# Patient Record
Sex: Female | Born: 1940 | Race: White | Marital: Married | Smoking: Never smoker
Health system: Southern US, Community
[De-identification: ages and names within clinical notes are randomized; demographics above are authoritative.]

## PROBLEM LIST (undated history)

## (undated) DIAGNOSIS — H9190 Unspecified hearing loss, unspecified ear: Secondary | ICD-10-CM

## (undated) DIAGNOSIS — I10 Essential (primary) hypertension: Secondary | ICD-10-CM

## (undated) DIAGNOSIS — E079 Disorder of thyroid, unspecified: Secondary | ICD-10-CM

## (undated) DIAGNOSIS — K900 Celiac disease: Secondary | ICD-10-CM

## (undated) HISTORY — PX: ABDOMINAL HYSTERECTOMY: SHX81

## (undated) HISTORY — DX: Unspecified hearing loss, unspecified ear: H91.90

## (undated) HISTORY — DX: Essential (primary) hypertension: I10

## (undated) HISTORY — DX: Celiac disease: K90.0

## (undated) HISTORY — DX: Disorder of thyroid, unspecified: E07.9

---

## 2010-12-08 ENCOUNTER — Emergency Department: Payer: Self-pay | Admitting: *Deleted

## 2011-09-04 ENCOUNTER — Ambulatory Visit: Payer: Self-pay | Admitting: Ophthalmology

## 2011-11-06 ENCOUNTER — Ambulatory Visit: Payer: Self-pay | Admitting: Ophthalmology

## 2011-12-16 ENCOUNTER — Other Ambulatory Visit: Payer: Self-pay | Admitting: *Deleted

## 2011-12-16 DIAGNOSIS — Z1231 Encounter for screening mammogram for malignant neoplasm of breast: Secondary | ICD-10-CM

## 2011-12-24 ENCOUNTER — Ambulatory Visit: Payer: Self-pay

## 2012-01-21 ENCOUNTER — Ambulatory Visit
Admission: RE | Admit: 2012-01-21 | Discharge: 2012-01-21 | Disposition: A | Discharge status: Home / Self Care | Payer: Medicare Other | Source: Ambulatory Visit | Attending: *Deleted | Admitting: *Deleted

## 2012-01-21 DIAGNOSIS — Z1231 Encounter for screening mammogram for malignant neoplasm of breast: Secondary | ICD-10-CM

## 2012-03-10 ENCOUNTER — Ambulatory Visit: Payer: Self-pay | Admitting: Family Medicine

## 2012-12-28 ENCOUNTER — Ambulatory Visit (INDEPENDENT_AMBULATORY_CARE_PROVIDER_SITE_OTHER): Payer: Medicare Other | Admitting: Podiatry

## 2012-12-28 ENCOUNTER — Ambulatory Visit (INDEPENDENT_AMBULATORY_CARE_PROVIDER_SITE_OTHER): Payer: Medicare Other

## 2012-12-28 ENCOUNTER — Encounter: Payer: Self-pay | Admitting: Podiatry

## 2012-12-28 VITALS — BP 154/84 | HR 64 | Resp 14 | Ht 61.0 in | Wt 138.0 lb

## 2012-12-28 DIAGNOSIS — M79672 Pain in left foot: Secondary | ICD-10-CM

## 2012-12-28 DIAGNOSIS — M722 Plantar fascial fibromatosis: Secondary | ICD-10-CM

## 2012-12-28 DIAGNOSIS — M79609 Pain in unspecified limb: Secondary | ICD-10-CM

## 2012-12-28 MED ORDER — METHYLPREDNISOLONE (PAK) 4 MG PO TABS: ORAL_TABLET | ORAL | Status: DC

## 2012-12-28 MED ORDER — MELOXICAM 15 MG PO TABS: 15.0000 mg | ORAL_TABLET | Freq: Every day | ORAL | Status: DC

## 2012-12-28 NOTE — Patient Instructions (Signed)
Plantar Fasciitis (Heel Spur Syndrome) with Rehab The plantar fascia is a fibrous, ligament-like, soft-tissue structure that spans the bottom of the foot. Plantar fasciitis is a condition that causes pain in the foot due to inflammation of the tissue. SYMPTOMS   Pain and tenderness on the underneath side of the foot.  Pain that worsens with standing or walking. CAUSES  Plantar fasciitis is caused by irritation and injury to the plantar fascia on the underneath side of the foot. Common mechanisms of injury include:  Direct trauma to bottom of the foot.  Damage to a small nerve that runs under the foot where the main fascia attaches to the heel bone.  Stress placed on the plantar fascia due to bone spurs. RISK INCREASES WITH:   Activities that place stress on the plantar fascia (running, jumping, pivoting, or cutting).  Poor strength and flexibility.  Improperly fitted shoes.  Tight calf muscles.  Flat feet.  Failure to warm-up properly before activity.  Obesity. PREVENTION  Warm up and stretch properly before activity.  Allow for adequate recovery between workouts.  Maintain physical fitness:  Strength, flexibility, and endurance.  Cardiovascular fitness.  Maintain a health body weight.  Avoid stress on the plantar fascia.  Wear properly fitted shoes, including arch supports for individuals who have flat feet. PROGNOSIS  If treated properly, then the symptoms of plantar fasciitis usually resolve without surgery. However, occasionally surgery is necessary. RELATED COMPLICATIONS   Recurrent symptoms that may result in a chronic condition.  Problems of the lower back that are caused by compensating for the injury, such as limping.  Pain or weakness of the foot during push-off following surgery.  Chronic inflammation, scarring, and partial or complete fascia tear, occurring more often from repeated injections. TREATMENT  Treatment initially involves the use of  ice and medication to help reduce pain and inflammation. The use of strengthening and stretching exercises may help reduce pain with activity, especially stretches of the Achilles tendon. These exercises may be performed at home or with a therapist. Your caregiver may recommend that you use heel cups of arch supports to help reduce stress on the plantar fascia. Occasionally, corticosteroid injections are given to reduce inflammation. If symptoms persist for greater than 6 months despite non-surgical (conservative), then surgery may be recommended.  MEDICATION   If pain medication is necessary, then nonsteroidal anti-inflammatory medications, such as aspirin and ibuprofen, or other minor pain relievers, such as acetaminophen, are often recommended.  Do not take pain medication within 7 days before surgery.  Prescription pain relievers may be given if deemed necessary by your caregiver. Use only as directed and only as much as you need.  Corticosteroid injections may be given by your caregiver. These injections should be reserved for the most serious cases, because they may only be given a certain number of times. HEAT AND COLD  Cold treatment (icing) relieves pain and reduces inflammation. Cold treatment should be applied for 10 to 15 minutes every 2 to 3 hours for inflammation and pain and immediately after any activity that aggravates your symptoms. Use ice packs or massage the area with a piece of ice (ice massage).  Heat treatment may be used prior to performing the stretching and strengthening activities prescribed by your caregiver, physical therapist, or athletic trainer. Use a heat pack or soak the injury in warm water. SEEK IMMEDIATE MEDICAL CARE IF:  Treatment seems to offer no benefit, or the condition worsens.  Any medications produce adverse side effects. EXERCISES RANGE   OF MOTION (ROM) AND STRETCHING EXERCISES - Plantar Fasciitis (Heel Spur Syndrome) These exercises may help you  when beginning to rehabilitate your injury. Your symptoms may resolve with or without further involvement from your physician, physical therapist or athletic trainer. While completing these exercises, remember:   Restoring tissue flexibility helps normal motion to return to the joints. This allows healthier, less painful movement and activity.  An effective stretch should be held for at least 30 seconds.  A stretch should never be painful. You should only feel a gentle lengthening or release in the stretched tissue. RANGE OF MOTION - Toe Extension, Flexion  Sit with your right / left leg crossed over your opposite knee.  Grasp your toes and gently pull them back toward the top of your foot. You should feel a stretch on the bottom of your toes and/or foot.  Hold this stretch for __________ seconds.  Now, gently pull your toes toward the bottom of your foot. You should feel a stretch on the top of your toes and or foot.  Hold this stretch for __________ seconds. Repeat __________ times. Complete this stretch __________ times per day.  RANGE OF MOTION - Ankle Dorsiflexion, Active Assisted  Remove shoes and sit on a chair that is preferably not on a carpeted surface.  Place right / left foot under knee. Extend your opposite leg for support.  Keeping your heel down, slide your right / left foot back toward the chair until you feel a stretch at your ankle or calf. If you do not feel a stretch, slide your bottom forward to the edge of the chair, while still keeping your heel down.  Hold this stretch for __________ seconds. Repeat __________ times. Complete this stretch __________ times per day.  STRETCH  Gastroc, Standing  Place hands on wall.  Extend right / left leg, keeping the front knee somewhat bent.  Slightly point your toes inward on your back foot.  Keeping your right / left heel on the floor and your knee straight, shift your weight toward the wall, not allowing your back to  arch.  You should feel a gentle stretch in the right / left calf. Hold this position for __________ seconds. Repeat __________ times. Complete this stretch __________ times per day. STRETCH  Soleus, Standing  Place hands on wall.  Extend right / left leg, keeping the other knee somewhat bent.  Slightly point your toes inward on your back foot.  Keep your right / left heel on the floor, bend your back knee, and slightly shift your weight over the back leg so that you feel a gentle stretch deep in your back calf.  Hold this position for __________ seconds. Repeat __________ times. Complete this stretch __________ times per day. STRETCH  Gastrocsoleus, Standing  Note: This exercise can place a lot of stress on your foot and ankle. Please complete this exercise only if specifically instructed by your caregiver.   Place the ball of your right / left foot on a step, keeping your other foot firmly on the same step.  Hold on to the wall or a rail for balance.  Slowly lift your other foot, allowing your body weight to press your heel down over the edge of the step.  You should feel a stretch in your right / left calf.  Hold this position for __________ seconds.  Repeat this exercise with a slight bend in your right / left knee. Repeat __________ times. Complete this stretch __________ times per day.    STRENGTHENING EXERCISES - Plantar Fasciitis (Heel Spur Syndrome)  These exercises may help you when beginning to rehabilitate your injury. They may resolve your symptoms with or without further involvement from your physician, physical therapist or athletic trainer. While completing these exercises, remember:   Muscles can gain both the endurance and the strength needed for everyday activities through controlled exercises.  Complete these exercises as instructed by your physician, physical therapist or athletic trainer. Progress the resistance and repetitions only as guided. STRENGTH - Towel  Curls  Sit in a chair positioned on a non-carpeted surface.  Place your foot on a towel, keeping your heel on the floor.  Pull the towel toward your heel by only curling your toes. Keep your heel on the floor.  If instructed by your physician, physical therapist or athletic trainer, add ____________________ at the end of the towel. Repeat __________ times. Complete this exercise __________ times per day. STRENGTH - Ankle Inversion  Secure one end of a rubber exercise band/tubing to a fixed object (table, pole). Loop the other end around your foot just before your toes.  Place your fists between your knees. This will focus your strengthening at your ankle.  Slowly, pull your big toe up and in, making sure the band/tubing is positioned to resist the entire motion.  Hold this position for __________ seconds.  Have your muscles resist the band/tubing as it slowly pulls your foot back to the starting position. Repeat __________ times. Complete this exercises __________ times per day.  Document Released: 02/04/2005 Document Revised: 04/29/2011 Document Reviewed: 05/19/2008 [employer] Patient Information 2014 [employer]. Plantar Fasciitis Plantar fasciitis is a common condition that causes foot pain. It is soreness (inflammation) of the band of tough fibrous tissue on the bottom of the foot that runs from the heel bone (calcaneus) to the ball of the foot. The cause of this soreness may be from excessive standing, poor fitting shoes, running on hard surfaces, being overweight, having an abnormal walk, or overuse (this is common in runners) of the painful foot or feet. It is also common in aerobic exercise dancers and ballet dancers. SYMPTOMS  Most people with plantar fasciitis complain of:  Severe pain in the morning on the bottom of their foot especially when taking the first steps out of bed. This pain recedes after a few minutes of walking.  Severe pain is experienced also during walking  following a long period of inactivity.  Pain is worse when walking barefoot or up stairs DIAGNOSIS   Your caregiver will diagnose this condition by examining and feeling your foot.  Special tests such as X-rays of your foot, are usually not needed. PREVENTION   Consult a sports medicine professional before beginning a new exercise program.  Walking programs offer a good workout. With walking there is a lower chance of overuse injuries common to runners. There is less impact and less jarring of the joints.  Begin all new exercise programs slowly. If problems or pain develop, decrease the amount of time or distance until you are at a comfortable level.  Wear good shoes and replace them regularly.  Stretch your foot and the heel cords at the back of the ankle (Achilles tendon) both before and after exercise.  Run or exercise on even surfaces that are not hard. For example, asphalt is better than pavement.  Do not run barefoot on hard surfaces.  If using a treadmill, vary the incline.  Do not continue to workout if you have foot or joint   problems. Seek professional help if they do not improve. HOME CARE INSTRUCTIONS   Avoid activities that cause you pain until you recover.  Use ice or cold packs on the problem or painful areas after working out.  Only take over-the-counter or prescription medicines for pain, discomfort, or fever as directed by your caregiver.  Soft shoe inserts or athletic shoes with air or gel sole cushions may be helpful.  If problems continue or become more severe, consult a sports medicine caregiver or your own health care provider. Cortisone is a potent anti-inflammatory medication that may be injected into the painful area. You can discuss this treatment with your caregiver. MAKE SURE YOU:   Understand these instructions.  Will watch your condition.  Will get help right away if you are not doing well or get worse. Document Released: 10/30/2000 Document  Revised: 04/29/2011 Document Reviewed: 12/30/2007 [employer] Patient Information 2014 [employer].  

## 2012-12-28 NOTE — Progress Notes (Signed)
Frances Phillips presents today as a 72 year old white female the chief complaint of a painful left foot. She states his left heel than bother me for the past 2-1/2-3 months. Relates that he came on all of a sudden seems to be getting worse on a daily basis. He time that I been on it it really starts to bother me. Afternoon seems to be the worst. She's tried ibuprofen ice and heat all to no avail. Denies trauma to the foot.  Objective: Vital signs are stable she is alert and oriented x3. I have reviewed her past medical history medications and allergies. Review of systems reviewed. Lower extremity examination reveals: Strong palpable pulses equal bilateral. Capillary fill time to digits one through 5 of the bilateral foot is noted to be immediate. Neurologic sensorium was intact per since once the monofilament. Deep tendon reflexes are intact bilateral. Muscle strength is 5 over 5 dorsiflexors plantar flexors inverters everters all intrinsic musculature is intact. Orthopedic evaluation demonstrates no pain on medial lateral compression of the calcaneus however she does have pain on direct palpation of the medial calcaneal tubercle of the left heel. Radiographically evaluation demonstrate soft tissue increase in density at the plantar fascial calcaneal insertion site indicative of plantar fasciitis. And mild pes planus  Assessment: Plantar fasciitis left.  Plan: Injected the left heel today, plantar fascial strapping left, night splint left, wrote her prescription for Sterapred Dosepak to be followed by Mobic, discussed appropriate shoe gear stretching exercises and ice therapy, she was given both oral and written home-going instructions.

## 2012-12-28 NOTE — Progress Notes (Signed)
N throbbing    L left plantar heel  D 2 1/2 to 3 months  O all of a sudden  C worse  A being on it , by the afternoon  Worse  T ice , ibuprofen , heat

## 2013-01-27 ENCOUNTER — Ambulatory Visit (INDEPENDENT_AMBULATORY_CARE_PROVIDER_SITE_OTHER): Payer: Medicare Other | Admitting: Podiatry

## 2013-01-27 ENCOUNTER — Encounter: Payer: Self-pay | Admitting: Podiatry

## 2013-01-27 VITALS — BP 134/70 | HR 64 | Resp 18

## 2013-01-27 DIAGNOSIS — M722 Plantar fascial fibromatosis: Secondary | ICD-10-CM

## 2013-01-27 NOTE — Progress Notes (Signed)
   Subjective:    Patient ID: Frances Phillips, female    DOB: Feb 04, 1941, 72 y.o.   MRN: [id number]  HPI Comments: "it is doing so much better , i would say a 100% better, the medicine the boot and that thing you put on my foot was wonderful"     Review of Systems     Objective:   Physical Exam: Pulses are strongly palpable bilateral. No pain on medial lateral compression of the calcaneus. No erythema edema cellulitis drainage or odor.        Assessment & Plan:  Assessment: Resolved plantar fasciitis.  Plan: Continue conservative therapies x1 month then discontinue provided she has no heel pain

## 2014-09-09 ENCOUNTER — Ambulatory Visit
Admission: RE | Admit: 2014-09-09 | Discharge: 2014-09-09 | Disposition: A | Discharge status: Home / Self Care | Payer: Medicare Other | Source: Ambulatory Visit

## 2014-09-09 ENCOUNTER — Other Ambulatory Visit: Payer: Self-pay | Admitting: Family Medicine

## 2014-09-09 ENCOUNTER — Other Ambulatory Visit: Payer: Self-pay

## 2014-09-09 DIAGNOSIS — R928 Other abnormal and inconclusive findings on diagnostic imaging of breast: Secondary | ICD-10-CM

## 2014-09-09 DIAGNOSIS — Z1231 Encounter for screening mammogram for malignant neoplasm of breast: Secondary | ICD-10-CM

## 2014-09-14 ENCOUNTER — Ambulatory Visit
Admission: RE | Admit: 2014-09-14 | Discharge: 2014-09-14 | Disposition: A | Discharge status: Home / Self Care | Payer: Medicare Other | Source: Ambulatory Visit | Attending: Family Medicine | Admitting: Family Medicine

## 2014-09-14 DIAGNOSIS — R928 Other abnormal and inconclusive findings on diagnostic imaging of breast: Secondary | ICD-10-CM

## 2014-12-07 ENCOUNTER — Ambulatory Visit
Admission: EM | Admit: 2014-12-07 | Discharge: 2014-12-07 | Disposition: A | Discharge status: Home / Self Care | Payer: Medicare Other | Attending: Family Medicine | Admitting: Family Medicine

## 2014-12-07 ENCOUNTER — Ambulatory Visit: Payer: Medicare Other

## 2014-12-07 ENCOUNTER — Encounter: Payer: Self-pay | Admitting: Emergency Medicine

## 2014-12-07 DIAGNOSIS — S60212A Contusion of left wrist, initial encounter: Secondary | ICD-10-CM | POA: Insufficient documentation

## 2014-12-07 DIAGNOSIS — S63502A Unspecified sprain of left wrist, initial encounter: Secondary | ICD-10-CM | POA: Diagnosis not present

## 2014-12-07 DIAGNOSIS — W11XXXA Fall on and from ladder, initial encounter: Secondary | ICD-10-CM | POA: Insufficient documentation

## 2014-12-07 DIAGNOSIS — M25532 Pain in left wrist: Secondary | ICD-10-CM | POA: Diagnosis present

## 2014-12-07 DIAGNOSIS — K900 Celiac disease: Secondary | ICD-10-CM | POA: Diagnosis not present

## 2014-12-07 DIAGNOSIS — I10 Essential (primary) hypertension: Secondary | ICD-10-CM | POA: Diagnosis not present

## 2014-12-07 DIAGNOSIS — Z79899 Other long term (current) drug therapy: Secondary | ICD-10-CM | POA: Insufficient documentation

## 2014-12-07 NOTE — ED Notes (Signed)
Patient states last week she stepped off the next to the last step of the ladder and fell injuring her left wrist and forearm.  Has iced it but it isn't getting better.

## 2014-12-07 NOTE — ED Provider Notes (Signed)
CSN: [id number]     Arrival date & time 12/07/14  1217 History   First MD Initiated Contact with Patient 12/07/14 1350     Chief Complaint  Patient presents with  . Arm Pain   (Consider location/radiation/quality/duration/timing/severity/associated sxs/prior Treatment) HPI Comments: 74 yo female presents with a c/o left wrist pain for the past week. States she fell on her kitchen floor last week when she was coming off a ladder on the next to last step (about 2 feet high).   The history is provided by the patient.    Past Medical History  Diagnosis Date  . Hypertension   . Celiac disease    Past Surgical History  Procedure Laterality Date  . Abdominal hysterectomy     History reviewed. No pertinent family history. Social History  Substance Use Topics  . Smoking status: Never Smoker   . Smokeless tobacco: Never Used  . Alcohol Use: No   OB History    No data available     Review of Systems  Allergies  Gluten meal and Percocet  Home Medications   Prior to Admission medications   Medication Sig Start Date End Date Taking? Authorizing Provider  venlafaxine (EFFEXOR) 37.5 MG tablet Take 37.5 mg by mouth 2 (two) times daily.   Yes Historical Provider, MD  hydrOXYzine (VISTARIL) 25 MG capsule Take 25 mg by mouth 3 (three) times daily as needed.    Historical Provider, MD  levothyroxine (SYNTHROID, LEVOTHROID) 100 MCG tablet Take 100 mcg by mouth daily before breakfast.    Historical Provider, MD  lisinopril (PRINIVIL,ZESTRIL) 10 MG tablet Take 10 mg by mouth daily.    Historical Provider, MD  meloxicam (MOBIC) 15 MG tablet Take 1 tablet (15 mg total) by mouth daily. 12/28/12   Max T Hyatt, DPM  methylPREDNIsolone (MEDROL DOSPACK) 4 MG tablet follow package directions 12/28/12   Max T Milinda Pointer, DPM   Meds Ordered and Administered this Visit  Medications - No data to display  BP 149/53 mmHg  Pulse 57  Temp(Src) 97.7 F (36.5 C) (Tympanic)  Resp 16  Ht 5\' 2"  (1.575 m)   Wt 140 lb (63.504 kg)  BMI 25.60 kg/m2  SpO2 100% No data found.   Physical Exam  Constitutional: She appears well-developed and well-nourished. No distress.  Musculoskeletal:       Left wrist: She exhibits tenderness, bony tenderness (over the distal radius) and swelling (mild). She exhibits normal range of motion, no effusion, no crepitus, no deformity and no laceration.  Left hand/arm neurovascularly intact  Skin: She is not diaphoretic.  Nursing note and vitals reviewed.   ED Course  Procedures (including critical care time)  Labs Review Labs Reviewed - No data to display  Imaging Review Dg Wrist Complete Left  12/07/2014  CLINICAL DATA:  [place] off ladder 1 week ago, left wrist pain EXAM: LEFT WRIST - COMPLETE 3+ VIEW COMPARISON:  None. FINDINGS: Four views of the left wrist submitted. No acute fracture or subluxation. No radiopaque foreign body. IMPRESSION: Negative. Electronically Signed   By: Lahoma Crocker M.D.   On: 12/07/2014 14:44     Visual Acuity Review  Right Eye Distance:   Left Eye Distance:   Bilateral Distance:    Right Eye Near:   Left Eye Near:    Bilateral Near:         MDM   1. Wrist contusion, left, initial encounter   2. Wrist sprain, left, initial encounter    1. x-ray results  and diagnosis reviewed with patient 2. velcro wrist splint given to patient 3. Recommend supportive treatment with rest, ice, otc NSAIDs/analgesics 4. Follow prn if symptoms worsen or don't improve    Norval Gable, MD 12/07/14 1542

## 2015-07-24 DIAGNOSIS — C4491 Basal cell carcinoma of skin, unspecified: Secondary | ICD-10-CM

## 2015-07-24 HISTORY — DX: Basal cell carcinoma of skin, unspecified: C44.91

## 2015-09-18 ENCOUNTER — Other Ambulatory Visit: Payer: Self-pay | Admitting: *Deleted

## 2015-09-18 DIAGNOSIS — Z1231 Encounter for screening mammogram for malignant neoplasm of breast: Secondary | ICD-10-CM

## 2015-09-27 ENCOUNTER — Ambulatory Visit
Admission: RE | Admit: 2015-09-27 | Discharge: 2015-09-27 | Disposition: A | Discharge status: Home / Self Care | Payer: Medicare Other | Source: Ambulatory Visit | Attending: *Deleted | Admitting: *Deleted

## 2015-09-27 ENCOUNTER — Other Ambulatory Visit: Payer: Self-pay | Admitting: *Deleted

## 2015-09-27 DIAGNOSIS — Z1231 Encounter for screening mammogram for malignant neoplasm of breast: Secondary | ICD-10-CM

## 2016-07-19 ENCOUNTER — Ambulatory Visit (INDEPENDENT_AMBULATORY_CARE_PROVIDER_SITE_OTHER): Payer: 59 | Admitting: Podiatry

## 2016-07-19 ENCOUNTER — Encounter: Payer: Self-pay | Admitting: Podiatry

## 2016-07-19 ENCOUNTER — Ambulatory Visit (INDEPENDENT_AMBULATORY_CARE_PROVIDER_SITE_OTHER): Payer: 59

## 2016-07-19 DIAGNOSIS — R52 Pain, unspecified: Secondary | ICD-10-CM

## 2016-07-19 DIAGNOSIS — S92515A Nondisplaced fracture of proximal phalanx of left lesser toe(s), initial encounter for closed fracture: Secondary | ICD-10-CM | POA: Diagnosis not present

## 2016-07-19 NOTE — Progress Notes (Signed)
   Subjective:    Patient ID: MAEGAN BULLER, female    DOB: [birth year], 76 y.o.   MRN: [id number]  HPI  76 year old female presents the also the daughter for concerns of left foot pain. She states this started about 6 weeks ago after she fell hitting the floor jack. She states that after that she had pain to her toes postoperatively she had pain to her fourth toe. She says the fourth toe still swollen and still painful at times. She's had no treatment for this and she does not see a doctor after the injury. No other complaints today.  Review of Systems  Constitutional: Positive for fatigue.  HENT: Positive for hearing loss.   Respiratory: Positive for wheezing.   Gastrointestinal:       Bloating  Musculoskeletal: Positive for back pain.  Skin:       Change in nails  Allergic/Immunologic: Positive for food allergies.       Objective:   Physical Exam General: AAO x3, NAD  Dermatological: Skin is warm, dry and supple bilateral. Nails x 10 are well manicured; remaining integument appears unremarkable at this time. There are no open sores, no preulcerative lesions, no rash or signs of infection present.  Vascular: Dorsalis Pedis artery and Posterior Tibial artery pedal pulses are 2/4 bilateral with immedate capillary fill time.  There is no pain with calf compression, swelling, warmth, erythema.   Neruologic: Grossly intact via light touch bilateral. Vibratory intact via tuning fork bilateral. Protective threshold with Semmes Wienstein monofilament intact to all pedal sites bilateral.   Musculoskeletal: There is mild tenderness as well as swelling to the left fourth toe. Is no pain in the metatarsal and there is no pain with MPJ range of motion. The toes in rectus position. No other areas of tenderness identified. Muscular strength 5/5 in all groups tested bilateral.  Gait: Unassisted, Nonantalgic.      Assessment & Plan:  77 year old female left fourth toe fracture -Treatment  options discussed including all alternatives, risks, and complications -Etiology of symptoms were discussed -X-rays were obtained and reviewed with the patient. Callus formation present to the diaphysis of the right fourth proximal phalanx consistent with fracture. -Given that she still has pain in the area will immobilize and surgical shoe which was dispensed today. Ice and elevation limited activity. -Follow-up as scheduled or sooner if needed.  *x-ray next appointment    Celesta Gentile, DPM

## 2016-08-09 ENCOUNTER — Encounter: Payer: Self-pay | Admitting: Podiatry

## 2016-08-09 ENCOUNTER — Ambulatory Visit (INDEPENDENT_AMBULATORY_CARE_PROVIDER_SITE_OTHER): Payer: Medicare Other | Admitting: Podiatry

## 2016-08-09 ENCOUNTER — Ambulatory Visit (INDEPENDENT_AMBULATORY_CARE_PROVIDER_SITE_OTHER): Payer: Medicare Other

## 2016-08-09 ENCOUNTER — Other Ambulatory Visit: Payer: Self-pay | Admitting: Podiatry

## 2016-08-09 DIAGNOSIS — S92515D Nondisplaced fracture of proximal phalanx of left lesser toe(s), subsequent encounter for fracture with routine healing: Secondary | ICD-10-CM | POA: Diagnosis not present

## 2016-08-11 NOTE — Progress Notes (Signed)
Subjective: Frances Phillips presents the office today for follow-up evaluation of left fourth toe fracture. She states that since last appointment she's been in a surgical shoe all the time of her foot feels much better and she's having no pain. She states "I wished I would have just come from the start". She has no new concerns.  Denies any systemic complaints such as fevers, chills, nausea, vomiting. No acute changes since last appointment, and no other complaints at this time.   Objective: AAO x3, NAD, presents in surgical shoe.  DP/PT pulses palpable bilaterally, CRT less than 3 seconds There is no pain to the 4th digit or to other areas of the foot. There is no overlying edema, erythema, increase in warmth.No open lesions or pre-ulcerative lesions.  No pain with calf compression, swelling, warmth, erythema  Assessment: Follow-up evaluation of left 4th toe fracture., currently with no pain.   Plan: -All treatment options discussed with the patient including all alternatives, risks, complications.  -X-rays were obtained and reviewed with the patient. Evidence of healing fracture to the left fourth digit. No other evidence of acute fracture identified today. -At this time she started transition to a regular shoe as tolerated. Continue icing gradually increase activity level. -I will see her back as needed. If she has any continued pain or any recurrent symptoms to call the office and she agrees and verbally understands.  -Patient encouraged to call the office with any questions, concerns, change in symptoms.   Celesta Gentile, DPM

## 2016-08-30 ENCOUNTER — Other Ambulatory Visit: Payer: Self-pay | Admitting: Family Medicine

## 2016-08-30 DIAGNOSIS — Z1231 Encounter for screening mammogram for malignant neoplasm of breast: Secondary | ICD-10-CM

## 2016-09-03 ENCOUNTER — Other Ambulatory Visit: Payer: Self-pay | Admitting: Family Medicine

## 2016-09-03 DIAGNOSIS — M8580 Other specified disorders of bone density and structure, unspecified site: Secondary | ICD-10-CM

## 2016-09-27 ENCOUNTER — Ambulatory Visit
Admission: RE | Admit: 2016-09-27 | Discharge: 2016-09-27 | Disposition: A | Discharge status: Home / Self Care | Payer: Medicare Other | Source: Ambulatory Visit | Attending: Family Medicine | Admitting: Family Medicine

## 2016-09-27 DIAGNOSIS — Z1231 Encounter for screening mammogram for malignant neoplasm of breast: Secondary | ICD-10-CM

## 2016-09-27 DIAGNOSIS — M8580 Other specified disorders of bone density and structure, unspecified site: Secondary | ICD-10-CM

## 2017-09-19 ENCOUNTER — Other Ambulatory Visit: Payer: Self-pay | Admitting: Family Medicine

## 2017-09-19 DIAGNOSIS — Z1231 Encounter for screening mammogram for malignant neoplasm of breast: Secondary | ICD-10-CM

## 2017-10-14 ENCOUNTER — Ambulatory Visit: Payer: Medicare Other

## 2017-11-14 ENCOUNTER — Ambulatory Visit: Payer: Medicare Other

## 2017-11-14 ENCOUNTER — Ambulatory Visit
Admission: RE | Admit: 2017-11-14 | Discharge: 2017-11-14 | Disposition: A | Discharge status: Home / Self Care | Payer: Medicare Other | Source: Ambulatory Visit | Attending: Family Medicine | Admitting: Family Medicine

## 2017-11-14 DIAGNOSIS — Z1231 Encounter for screening mammogram for malignant neoplasm of breast: Secondary | ICD-10-CM

## 2019-01-31 ENCOUNTER — Ambulatory Visit
Admission: EM | Admit: 2019-01-31 | Discharge: 2019-01-31 | Disposition: A | Discharge status: Home / Self Care | Payer: Medicare HMO | Attending: Family Medicine | Admitting: Family Medicine

## 2019-01-31 ENCOUNTER — Other Ambulatory Visit: Payer: Self-pay

## 2019-01-31 ENCOUNTER — Encounter: Payer: Self-pay | Admitting: Emergency Medicine

## 2019-01-31 DIAGNOSIS — J029 Acute pharyngitis, unspecified: Secondary | ICD-10-CM

## 2019-01-31 DIAGNOSIS — Z20828 Contact with and (suspected) exposure to other viral communicable diseases: Secondary | ICD-10-CM

## 2019-01-31 DIAGNOSIS — Z6379 Other stressful life events affecting family and household: Secondary | ICD-10-CM

## 2019-01-31 DIAGNOSIS — R05 Cough: Secondary | ICD-10-CM

## 2019-01-31 DIAGNOSIS — R5383 Other fatigue: Secondary | ICD-10-CM | POA: Diagnosis not present

## 2019-01-31 DIAGNOSIS — R062 Wheezing: Secondary | ICD-10-CM

## 2019-01-31 DIAGNOSIS — J069 Acute upper respiratory infection, unspecified: Secondary | ICD-10-CM

## 2019-01-31 DIAGNOSIS — Z20822 Contact with and (suspected) exposure to covid-19: Secondary | ICD-10-CM

## 2019-01-31 DIAGNOSIS — F418 Other specified anxiety disorders: Secondary | ICD-10-CM

## 2019-01-31 LAB — RAPID STREP SCREEN (MED CTR MEBANE ONLY): Streptococcus, Group A Screen (Direct): NEGATIVE

## 2019-01-31 MED ORDER — PREDNISONE 10 MG (21) PO TBPK: ORAL_TABLET | Freq: Every day | ORAL | 0 refills | Status: DC

## 2019-01-31 MED ORDER — ALBUTEROL SULFATE HFA 108 (90 BASE) MCG/ACT IN AERS: 1.0000 | INHALATION_SPRAY | RESPIRATORY_TRACT | 0 refills | Status: DC | PRN

## 2019-01-31 NOTE — ED Triage Notes (Signed)
Patient c/o cough, congestion, ear fullness, and sore throat that started on Thursday.  Patient denies fevers.

## 2019-01-31 NOTE — Discharge Instructions (Signed)
It was very nice seeing you today in clinic. Thank you for entrusting me with your care.   Rest and Stay HYDRATED. Water and electrolyte containing beverages (Gatorade, Pedialyte) are best to prevent dehydration and electrolyte abnormalities. Use medications as prescribed. May use Tylenol and/or Ibuprofen as needed for pain/fever.   You were tested for SARS-CoV-2 (novel coronavirus) today. Testing is performed by an outside lab ([facility]) and has variable turn around times ranging between 2-5 days. Current recommendations from the the [facility] and [facility] DHHS require that you remain at home until negative test results are have been received. In the event that your test results are positive, you will be contacted with further directives. These measures are being implemented out of an abundance of caution to prevent transmission and spread during the current SARS-CoV-2 pandemic.   Make arrangements to follow up with your regular doctor in 1 week for re-evaluation if not improving. If your symptoms/condition worsens, please seek follow up care either here or in the ER. Please remember, our [place] providers are "right here with you" when you need [place].   Again, it was my pleasure to take care of you today. Thank you for choosing our clinic. I hope that you start to feel better quickly.   Honor Loh, MSN, APRN, FNP-C, CEN Advanced Practice Provider [facility]

## 2019-02-01 LAB — NOVEL CORONAVIRUS, NAA (HOSP ORDER, SEND-OUT TO REF LAB; TAT 18-24 HRS): SARS-CoV-2, NAA: DETECTED — AB

## 2019-02-01 LAB — CULTURE, GROUP A STREP (THRC)

## 2019-02-01 NOTE — ED Provider Notes (Addendum)
[place], [place]   Name: Frances Phillips DOB: 1941-01-11 MRN: [id number] CSN: [id number] PCP: Carolyne Littles, MD  Arrival date and time:  01/31/19 1406  Chief Complaint:  Cough, Sore Throat, and Fatigue   NOTE: Prior to seeing the patient today, I have reviewed the triage nursing documentation and vital signs. Clinical staff has updated patient's PMH/PSHx, current medication list, and drug allergies/intolerances to ensure comprehensive history available to assist in medical decision making.   History:   HPI: Frances Phillips is a 78 y.o. female who presents today with complaints of cough, congestion, fullness in her LEFT ear, and sore throat that started approximately 3 days ago. Patient denies fevers. She reports that her cough has been non-productive and mild overall. She denies shortness of breath, however she feels likes wheezing. PMH (+) for seasonal allergies; does not take medication. Patient denies paranasal or frontal sinus tenderness. She denies that she has experienced any nausea, vomiting, diarrhea, or abdominal pain. She is eating and drinking well. Patient denies any perceived alterations to her sense of taste or smell. Patient presents out of concern for her personal health. Patient advises that her daughter and son-in-law both tested positive for SARS-CoV-2 (novel coronavirus) this past Thursday. Her last contact with these members of her family was on Wednesday. She has never been tested for SARS-CoV-2 (novel coronavirus) in the past per her report. Patient has been vaccinated for influenza this season. In efforts to conservatively manage her symptoms at home, the patient notes that she has used Robitussin and APAP, which have helped to improve her symptoms.    Past Medical History:  Diagnosis Date  . Celiac disease   . Hearing loss   . Hypertension   . Thyroid disease     Past Surgical History:  Procedure Laterality Date  . ABDOMINAL HYSTERECTOMY      History reviewed.  No pertinent family history.  Social History   Tobacco Use  . Smoking status: Never Smoker  . Smokeless tobacco: Never Used  Substance Use Topics  . Alcohol use: No  . Drug use: No    There are no problems to display for this patient.   Home Medications:    Current Meds  Medication Sig  . budesonide (ENTOCORT EC) 3 MG 24 hr capsule Take by mouth.  . cyanocobalamin 1000 MCG tablet Take by mouth.  . diclofenac Sodium (VOLTAREN) 1 % GEL diclofenac 1 % topical gel  APPLY TWO GRAMS TO THE AFFECTED AREA(S) FOUR TIMES DAILY  . donepezil (ARICEPT) 5 MG tablet   . gabapentin (NEURONTIN) 100 MG capsule gabapentin 100 mg capsule  . levothyroxine (SYNTHROID, LEVOTHROID) 100 MCG tablet Take 100 mcg by mouth daily before breakfast.  . lisinopril (PRINIVIL,ZESTRIL) 40 MG tablet   . omeprazole (PRILOSEC) 20 MG capsule omeprazole 20 mg capsule,delayed release  . pravastatin (PRAVACHOL) 40 MG tablet   . venlafaxine (EFFEXOR) 37.5 MG tablet Take 37.5 mg by mouth 2 (two) times daily.    Allergies:   Gluten meal and Percocet [oxycodone-acetaminophen]  Review of Systems (ROS): Review of Systems  Constitutional: Positive for fatigue. Negative for fever.  HENT: Positive for congestion, ear pain and sore throat. Negative for postnasal drip, rhinorrhea, sinus pressure, sinus pain and sneezing.   Eyes: Negative for pain, discharge and redness.  Respiratory: Positive for cough, chest tightness and wheezing. Negative for shortness of breath.   Cardiovascular: Negative for chest pain and palpitations.  Gastrointestinal: Negative for abdominal pain, diarrhea, nausea and vomiting.  Musculoskeletal: Negative for arthralgias, back pain, myalgias and neck pain.  Skin: Negative for color change, pallor and rash.  Allergic/Immunologic: Positive for environmental allergies (seasonal).  Neurological: Negative for dizziness, syncope, weakness and headaches.  Hematological: Negative for adenopathy.      Vital Signs: Today's Vitals   01/31/19 1419 01/31/19 1425 01/31/19 1443  BP:  (!) 141/57   Pulse:  65   Resp:  14   Temp:  98.9 F (37.2 C)   TempSrc:  Oral   SpO2:  100%   Weight: 125 lb (56.7 kg)    Height: 5\' 1"  (1.549 m)    PainSc: 0-No pain  0-No pain    Physical Exam: Physical Exam  Constitutional: She is oriented to person, place, and time and well-developed, well-nourished, and in no distress.  HENT:  Head: Normocephalic and atraumatic.  Right Ear: Tympanic membrane normal.  Left Ear: No swelling or tenderness. Tympanic membrane is injected. Tympanic membrane is not bulging. A middle ear effusion (serous) is present.  Nose: Mucosal edema (minor) present. No rhinorrhea or sinus tenderness.  Mouth/Throat: Uvula is midline and mucous membranes are normal. Posterior oropharyngeal erythema (mild with (+) clear PND) present. No oropharyngeal exudate or posterior oropharyngeal edema.  Eyes: Pupils are equal, round, and reactive to light.  Cardiovascular: Normal rate, regular rhythm, normal heart sounds and intact distal pulses.  Pulmonary/Chest: Effort normal. She has no decreased breath sounds. She has wheezes (mild expiratory). She has rhonchi (mild in upper airway; clears completely with cough).  No cough in clinic. No increased WOB or distress. SPO2 100% on RA.   Musculoskeletal:     Cervical back: Normal range of motion and neck supple.  Neurological: She is alert and oriented to person, place, and time. Gait normal.  Skin: Skin is warm and dry. No rash noted. She is not diaphoretic.  Psychiatric: Memory, affect and judgment normal. Her mood appears anxious (2/2 concerns about personal health and health status of family).  Nursing note and vitals reviewed.   Urgent Care Treatments / Results:  LABS: PLEASE NOTE: all labs that were ordered this encounter are listed, however only abnormal results are displayed. Labs Reviewed  NOVEL CORONAVIRUS, NAA (HOSP ORDER, SEND-OUT  TO REF LAB; TAT 18-24 HRS)   RAPID STREP SCREEN ([facility] ONLY)  CULTURE, GROUP A STREP [facility])   EKG: -None  RADIOLOGY: No results found.  PROCEDURES: Procedures  MEDICATIONS RECEIVED THIS VISIT: Medications - No data to display  PERTINENT CLINICAL COURSE NOTES/UPDATES:   Initial Impression / Assessment and Plan / Urgent Care Course:  Pertinent labs & imaging results that were available during my care of the patient were personally reviewed by me and considered in my medical decision making (see lab/imaging section of note for values and interpretations).  Frances Phillips is a 78 y.o. female who presents to [facility] today with complaints of Cough, Sore Throat, and Fatigue   Patient overall well appearing and in no acute distress today in clinic. Presenting symptoms (see HPI) and exam as documented above. She presents with symptoms associated with SARS-CoV-2 (novel coronavirus). (+) exposure to two family members who tested positive; see HPI. Discussed typical symptom constellation. Reviewed potential for infection and need for testing. Patient amenable to being tested. SARS-CoV-2 swab collected by certified clinical staff. Discussed variable turn around times associated with testing, as swabs are being processed at [facility], and have been taking between 2-5 days to come back. She was advised to self quarantine,  per [place] DHHS guidelines, until negative results received. These measures are being implemented out of an abundance of caution to prevent transmission and spread during the current SARS-CoV-2 pandemic.  Rapid streptococcal throat swab (-); reflex culture sent. Presenting symptoms consistent with acute viral illness. Until ruled out with confirmatory lab testing, SARS-CoV-2 remains part of the differential. Her testing is pending at this time. I discussed with her that her symptoms are felt to be viral in nature, thus antibiotics would not offer her any relief or improve  his symptoms any faster than conservative symptomatic management. Cough is minor; offered anti-tussive, however patient declined. She is wheezing and experiencing some generalized tightness in her chest. Will send in prescriptions for an albuterol MDI and a systemic steroid taper to help with her symptoms. Discussed supportive care measures at home during acute phase of illness. Patient to rest as much as possible. She was encouraged to ensure adequate hydration (water and ORS) to prevent dehydration and electrolyte derangements. Patient may use APAP and/or IBU on an as needed basis for pain/fever.    Discussed follow up with primary care physician in 1 week for re-evaluation. I have reviewed the follow up and strict return precautions for any new or worsening symptoms. Patient is aware of symptoms that would be deemed urgent/emergent, and would thus require further evaluation either here or in the emergency department. At the time of discharge, she verbalized understanding and consent with the discharge plan as it was reviewed with her. All questions were fielded by provider and/or clinic staff prior to patient discharge.    Final Clinical Impressions / Urgent Care Diagnoses:   Final diagnoses:  Viral URI with cough  Encounter for laboratory testing for COVID-19 virus  Wheezing  Anxiety about health  Stress due to illness of family member    New Prescriptions:  [place] Controlled Substance Registry consulted? Not Applicable  Meds ordered this encounter  Medications  . predniSONE (STERAPRED UNI-PAK 21 TAB) 10 MG (21) TBPK tablet    Sig: Take by mouth daily. 60 mg x 1 day, 50 mg x 1 day, 40 mg x 1 day, 30 mg x 1 day, 20 mg x 1 day, 10 mg x 1 day    Dispense:  21 tablet    Refill:  0  . albuterol (VENTOLIN HFA) 108 (90 Base) MCG/ACT inhaler    Sig: Inhale 1-2 puffs into the lungs every 4 (four) hours as needed for wheezing or shortness of breath.    Dispense:  18 g    Refill:  0    Recommended  Follow up Care:  Patient encouraged to follow up with the following provider within the specified time frame, or sooner as dictated by the severity of her symptoms. As always, she was instructed that for any urgent/emergent care needs, she should seek care either here or in the emergency department for more immediate evaluation.  Follow-up Information    Carolyne Littles, MD In 1 week.   Specialty: Family Medicine Why: General reassessment of symptoms if not improving Contact information: [place] [id number] [phone]         NOTE: This note was prepared using Dragon dictation software along with smaller phrase technology. Despite my best ability to proofread, there is the potential that transcriptional errors may still occur from this process, and are completely unintentional.    Karen Kitchens, NP 02/01/19 1300

## 2019-02-02 ENCOUNTER — Telehealth: Payer: Self-pay | Admitting: Unknown Physician Specialty

## 2019-02-02 ENCOUNTER — Other Ambulatory Visit: Payer: Self-pay | Admitting: Unknown Physician Specialty

## 2019-02-02 ENCOUNTER — Telehealth (HOSPITAL_COMMUNITY): Payer: Self-pay | Admitting: Emergency Medicine

## 2019-02-02 DIAGNOSIS — U071 COVID-19: Secondary | ICD-10-CM

## 2019-02-02 NOTE — Telephone Encounter (Signed)

## 2019-02-02 NOTE — Telephone Encounter (Signed)
  I connected by phone with Frances Phillips on 02/02/2019 at 6:55 PM to discuss the potential use of an new treatment for mild to moderate COVID-19 viral infection in non-hospitalized patients.  This patient is a 78 y.o. female that meets the FDA criteria for Emergency Use Authorization of bamlanivimab or casirivimab\imdevimab.  Has a (+) direct SARS-CoV-2 viral test result  Has mild or moderate COVID-19   Is ? 76 years of age and weighs ? 40 kg  Is NOT hospitalized due to COVID-19  Is NOT requiring oxygen therapy or requiring an increase in baseline oxygen flow rate due to COVID-19  Is within 10 days of symptom onset  Has at least one of the high risk factor(s) for progression to severe COVID-19 and/or hospitalization as defined in EUA.  Specific high risk criteria : >/= 76 yo   I have spoken and communicated the following to the patient or parent/caregiver:  1. FDA has authorized the emergency use of bamlanivimab and casirivimab\imdevimab for the treatment of mild to moderate COVID-19 in adults and pediatric patients with positive results of direct SARS-CoV-2 viral testing who are 76 years of age and older weighing at least 40 kg, and who are at high risk for progressing to severe COVID-19 and/or hospitalization.  2. The significant known and potential risks and benefits of bamlanivimab and casirivimab\imdevimab, and the extent to which such potential risks and benefits are unknown.  3. Information on available alternative treatments and the risks and benefits of those alternatives, including clinical trials.  4. Patients treated with bamlanivimab and casirivimab\imdevimab should continue to self-isolate and use infection control measures (e.g., wear mask, isolate, social distance, avoid sharing personal items, clean and disinfect "high touch" surfaces, and frequent handwashing) according to CDC guidelines.   5. The patient or parent/caregiver has the option to accept or refuse  bamlanivimab or casirivimab\imdevimab .  After reviewing this information with the patient, The patient agreed to proceed with receiving the bamlanimivab infusion and will be provided a copy of the Fact sheet prior to receiving the infusion.[name] 02/02/2019 6:55 PM

## 2019-02-03 LAB — CULTURE, GROUP A STREP (THRC)

## 2019-02-04 ENCOUNTER — Ambulatory Visit (HOSPITAL_COMMUNITY)
Admission: RE | Admit: 2019-02-04 | Discharge: 2019-02-04 | Disposition: A | Discharge status: Home / Self Care | Payer: Medicare Other | Source: Ambulatory Visit | Attending: Pulmonary Disease | Admitting: Pulmonary Disease

## 2019-02-04 DIAGNOSIS — U071 COVID-19: Secondary | ICD-10-CM | POA: Insufficient documentation

## 2019-02-04 DIAGNOSIS — Z23 Encounter for immunization: Secondary | ICD-10-CM | POA: Insufficient documentation

## 2019-02-04 MED ORDER — EPINEPHRINE 0.3 MG/0.3ML IJ SOAJ: 0.3000 mg | Freq: Once | INTRAMUSCULAR | Status: DC | PRN

## 2019-02-04 MED ORDER — DIPHENHYDRAMINE HCL 50 MG/ML IJ SOLN: 50.0000 mg | Freq: Once | INTRAMUSCULAR | Status: DC | PRN

## 2019-02-04 MED ORDER — ALBUTEROL SULFATE HFA 108 (90 BASE) MCG/ACT IN AERS: 2.0000 | INHALATION_SPRAY | Freq: Once | RESPIRATORY_TRACT | Status: DC | PRN

## 2019-02-04 MED ORDER — SODIUM CHLORIDE 0.9 % IV SOLN
700.0000 mg | Freq: Once | INTRAVENOUS | Status: AC
  Administered 2019-02-04: 12:00:00 700 mg via INTRAVENOUS
  Filled 2019-02-04: qty 20

## 2019-02-04 MED ORDER — SODIUM CHLORIDE 0.9 % IV SOLN
INTRAVENOUS | Status: DC | PRN
  Administered 2019-02-04: 11:00:00 250 mL via INTRAVENOUS

## 2019-02-04 MED ORDER — METHYLPREDNISOLONE SODIUM SUCC 125 MG IJ SOLR: 125.0000 mg | Freq: Once | INTRAMUSCULAR | Status: DC | PRN

## 2019-02-04 MED ORDER — FAMOTIDINE IN NACL 20-0.9 MG/50ML-% IV SOLN: 20.0000 mg | Freq: Once | INTRAVENOUS | Status: DC | PRN

## 2019-02-04 NOTE — Progress Notes (Signed)
  Diagnosis: COVID-19  Physician: Dr. Joya Gaskins  Procedure: [facility] Infusion Clinic Med: bamlanivimab infusion - Provided patient with bamlanimivab fact sheet for patients, parents and caregivers prior to infusion.  Complications: No immediate complications noted.  Discharge: Discharged home   [facility] 02/04/2019

## 2019-03-09 ENCOUNTER — Other Ambulatory Visit: Payer: Self-pay | Admitting: Family Medicine

## 2019-03-09 DIAGNOSIS — Z1231 Encounter for screening mammogram for malignant neoplasm of breast: Secondary | ICD-10-CM

## 2019-04-15 ENCOUNTER — Ambulatory Visit: Payer: Medicare Other

## 2019-04-26 ENCOUNTER — Other Ambulatory Visit: Payer: Self-pay | Admitting: Family Medicine

## 2019-04-26 DIAGNOSIS — E2839 Other primary ovarian failure: Secondary | ICD-10-CM

## 2021-03-03 ENCOUNTER — Encounter: Payer: Self-pay | Admitting: Emergency Medicine

## 2021-03-03 ENCOUNTER — Ambulatory Visit
Admission: EM | Admit: 2021-03-03 | Discharge: 2021-03-03 | Disposition: A | Discharge status: Home / Self Care | Payer: Medicare HMO

## 2021-03-03 ENCOUNTER — Other Ambulatory Visit: Payer: Self-pay

## 2021-03-03 DIAGNOSIS — J069 Acute upper respiratory infection, unspecified: Secondary | ICD-10-CM

## 2021-03-03 MED ORDER — MOLNUPIRAVIR EUA 200MG CAPSULE: 4.0000 | ORAL_CAPSULE | Freq: Two times a day (BID) | ORAL | 0 refills | Status: AC

## 2021-03-03 MED ORDER — IPRATROPIUM BROMIDE 0.06 % NA SOLN: 2.0000 | Freq: Four times a day (QID) | NASAL | 12 refills | Status: DC

## 2021-03-03 MED ORDER — BENZONATATE 100 MG PO CAPS: 200.0000 mg | ORAL_CAPSULE | Freq: Three times a day (TID) | ORAL | 0 refills | Status: DC

## 2021-03-03 NOTE — ED Triage Notes (Signed)
Patient c/o cough, fatigue, bodyaches that started yesterday.  Patient states that she loss her voice.  Patient denies fevers.  Patient's daughter states that she has not taken a home covid test.  Patient's daughter states that she has been around her husband who is positive for covid on Wed.  Patient's daughter would like for her to be treated for COVID.

## 2021-03-03 NOTE — ED Provider Notes (Signed)
[facility]    CSN: [id number] Arrival date & time: 03/03/21  1110      History   Chief Complaint Chief Complaint  Patient presents with   Generalized Body Aches    COVID Exposure   Cough    HPI Frances Phillips is a 81 y.o. female.   HPI  81 year old female here for evaluation of respiratory complaints.  Patient is here with her daughter for evaluation of runny nose, nasal congestion, postnasal drip, fatigue, nonproductive cough, wheezing, and body aches that started last night.  She also is reporting some hoarseness.  She denies any fever, sore throat, shortness of breath, or GI complaints.  Her son-in-law is currently being treated for COVID and tested +2 days before her symptoms started.  Past Medical History:  Diagnosis Date   Celiac disease    Hearing loss    Hypertension    Thyroid disease     There are no problems to display for this patient.   Past Surgical History:  Procedure Laterality Date   ABDOMINAL HYSTERECTOMY      OB History   No obstetric history on file.      Home Medications    Prior to Admission medications   Medication Sig Start Date End Date Taking? Authorizing Provider  benzonatate (TESSALON) 100 MG capsule Take 2 capsules (200 mg total) by mouth every 8 (eight) hours. 03/03/21  Yes Margarette Canada, NP  cyanocobalamin 1000 MCG tablet Take by mouth.   Yes [provider]  gabapentin (NEURONTIN) 100 MG capsule gabapentin 100 mg capsule   Yes [provider]  ipratropium (ATROVENT) 0.06 % nasal spray Place 2 sprays into both nostrils 4 (four) times daily. 03/03/21  Yes Margarette Canada, NP  levothyroxine (SYNTHROID, LEVOTHROID) 100 MCG tablet Take 100 mcg by mouth daily before breakfast.   Yes [provider]  lisinopril (PRINIVIL,ZESTRIL) 40 MG tablet  05/23/16  Yes [provider]  molnupiravir EUA (LAGEVRIO) 200 mg CAPS capsule Take 4 capsules (800 mg total) by mouth 2 (two) times daily for 5 days.  03/03/21 03/08/21 Yes Margarette Canada, NP  omeprazole (PRILOSEC) 20 MG capsule omeprazole 20 mg capsule,delayed release   Yes [provider]  pravastatin (PRAVACHOL) 40 MG tablet  05/23/16  Yes [provider]  venlafaxine (EFFEXOR) 37.5 MG tablet Take 37.5 mg by mouth 2 (two) times daily.   Yes [provider]  albuterol (VENTOLIN HFA) 108 (90 Base) MCG/ACT inhaler Inhale 1-2 puffs into the lungs every 4 (four) hours as needed for wheezing or shortness of breath. 01/31/19   Karen Kitchens, NP  budesonide (ENTOCORT EC) 3 MG 24 hr capsule Take 9 mg by mouth every morning. 12/27/20   [provider]  diclofenac Sodium (VOLTAREN) 1 % GEL diclofenac 1 % topical gel  APPLY TWO GRAMS TO THE AFFECTED AREA(S) FOUR TIMES DAILY    [provider]    Family History History reviewed. No pertinent family history.  Social History Social History   Tobacco Use   Smoking status: Never   Smokeless tobacco: Never  Vaping Use   Vaping Use: Never used  Substance Use Topics   Alcohol use: No   Drug use: No     Allergies   Gluten meal and Percocet [oxycodone-acetaminophen]   Review of Systems Review of Systems  Constitutional:  Positive for fatigue. Negative for activity change, appetite change and fever.  HENT:  Positive for congestion, postnasal drip and rhinorrhea. Negative for ear pain  and sore throat.   Respiratory:  Positive for cough and wheezing. Negative for shortness of breath.   Gastrointestinal:  Negative for diarrhea, nausea and vomiting.  Musculoskeletal:  Positive for arthralgias and myalgias.  Skin:  Negative for rash.  Psychiatric/Behavioral: Negative.      Physical Exam Triage Vital Signs ED Triage Vitals  Enc Vitals Group     BP 03/03/21 1140 140/64     Pulse Rate 03/03/21 1140 75     Resp 03/03/21 1140 15     Temp 03/03/21 1140 98.7 F (37.1 C)     Temp Source 03/03/21 1140 Oral     SpO2 03/03/21 1140 100 %     Weight 03/03/21  1135 139 lb (63 kg)     Height 03/03/21 1135 5\' 1"  (1.549 m)     Head Circumference --      Peak Flow --      Pain Score 03/03/21 1135 3     Pain Loc --      Pain Edu? --      Excl. in [place]? --    No data found.  Updated Vital Signs BP 140/64 (BP Location: Left Arm)    Pulse 75    Temp 98.7 F (37.1 C) (Oral)    Resp 15    Ht 5\' 1"  (1.549 m)    Wt 139 lb (63 kg)    SpO2 100%    BMI 26.26 kg/m   Visual Acuity Right Eye Distance:   Left Eye Distance:   Bilateral Distance:    Right Eye Near:   Left Eye Near:    Bilateral Near:     Physical Exam Vitals and nursing note reviewed.  Constitutional:      General: She is not in acute distress.    Appearance: Normal appearance. She is not ill-appearing.  HENT:     Head: Normocephalic and atraumatic.     Right Ear: Tympanic membrane, ear canal and external ear normal. There is no impacted cerumen.     Left Ear: Tympanic membrane, ear canal and external ear normal. There is no impacted cerumen.     Nose: Congestion and rhinorrhea present.     Mouth/Throat:     Mouth: Mucous membranes are moist.     Pharynx: Oropharynx is clear. Posterior oropharyngeal erythema present.  Cardiovascular:     Rate and Rhythm: Normal rate and regular rhythm.     Pulses: Normal pulses.     Heart sounds: Normal heart sounds. No murmur heard.   No friction rub. No gallop.  Pulmonary:     Effort: Pulmonary effort is normal.     Breath sounds: Normal breath sounds. No wheezing, rhonchi or rales.  Musculoskeletal:     Cervical back: Normal range of motion and neck supple.  Lymphadenopathy:     Cervical: No cervical adenopathy.  Skin:    General: Skin is warm and dry.     Capillary Refill: Capillary refill takes less than 2 seconds.     Findings: No erythema or rash.  Neurological:     General: No focal deficit present.     Mental Status: She is alert and oriented to person, place, and time.  Psychiatric:        Mood and Affect: Mood normal.         Behavior: Behavior normal.        Thought Content: Thought content normal.        Judgment: Judgment normal.  UC Treatments / Results  Labs (all labs ordered are listed, but only abnormal results are displayed) Labs Reviewed - No data to display  EKG   Radiology No results found.  Procedures Procedures (including critical care time)  Medications Ordered in UC Medications - No data to display  Initial Impression / Assessment and Plan / UC Course  I have reviewed the triage vital signs and the nursing notes.  Pertinent labs & imaging results that were available during my care of the patient were reviewed by me and considered in my medical decision making (see chart for details).  Is a very pleasant, nontoxic-appearing 82 year old female here for evaluation of respiratory complaints-exposed to her son-in-law who is currently being treated for COVID.  Her physical exam reveals protegrin tympanic membranes bilaterally with normal light reflex and clear external auditory canals.  Nasal mucosa is erythematous and edematous with clear discharge in both nares.  Oropharyngeal exam reveals mild posterior oropharyngeal erythema with clear postnasal drip.  No cervical lymphadenopathy appreciated exam.  Cardiopulmonary exam feels clung sounds all fields.  Patient is in excess of the viral URI with cough.  Patient and her daughter are requesting treatment given the close exposure to family with COVID.  We will treat with molnupiravir twice daily for 5 days, Atrovent nasal spray, and Tessalon Perles.  ER return precautions reviewed with patient and her daughter.   Final Clinical Impressions(s) / UC Diagnoses   Final diagnoses:  Viral URI with cough     Discharge Instructions      You will have to quarantine for 5 days from the start of your symptoms.  After 5 days you can break quarantine if your symptoms have improved and you have not had a fever for 24 hours without taking Tylenol or  ibuprofen.  Use over-the-counter Tylenol and ibuprofen as needed for body aches and fever.  Use the Atrovent nasal spray, 2 squirts in each nostril every 6 hours, as needed for runny nose and postnasal drip.  Use the Tessalon Perles every 8 hours during the day.  Take them with a small sip of water.  They may give you some numbness to the base of your tongue or a metallic taste in your mouth, this is normal.  Take the molnupiravir twice daily for 5 days for treatment of your COVID 19.  Return for reevaluation or see your primary care provider for any new or worsening symptoms.   If you develop any increased shortness of breath-especially at rest, you are unable to speak in full sentences, or is a late sign your lips are turning blue you need to go the ER for evaluation.      ED Prescriptions     Medication Sig Dispense Auth. Provider   molnupiravir EUA (LAGEVRIO) 200 mg CAPS capsule Take 4 capsules (800 mg total) by mouth 2 (two) times daily for 5 days. 40 capsule Margarette Canada, NP   benzonatate (TESSALON) 100 MG capsule Take 2 capsules (200 mg total) by mouth every 8 (eight) hours. 21 capsule Margarette Canada, NP   ipratropium (ATROVENT) 0.06 % nasal spray Place 2 sprays into both nostrils 4 (four) times daily. 15 mL Margarette Canada, NP      PDMP not reviewed this encounter.   Margarette Canada, NP 03/03/21 1307

## 2021-03-03 NOTE — Discharge Instructions (Signed)
You will have to quarantine for 5 days from the start of your symptoms.  After 5 days you can break quarantine if your symptoms have improved and you have not had a fever for 24 hours without taking Tylenol or ibuprofen.  Use over-the-counter Tylenol and ibuprofen as needed for body aches and fever.  Use the Atrovent nasal spray, 2 squirts in each nostril every 6 hours, as needed for runny nose and postnasal drip.  Use the Tessalon Perles every 8 hours during the day.  Take them with a small sip of water.  They may give you some numbness to the base of your tongue or a metallic taste in your mouth, this is normal.  Take the molnupiravir twice daily for 5 days for treatment of your COVID 19.  Return for reevaluation or see your primary care provider for any new or worsening symptoms.   If you develop any increased shortness of breath-especially at rest, you are unable to speak in full sentences, or is a late sign your lips are turning blue you need to go the ER for evaluation.

## 2021-04-23 ENCOUNTER — Other Ambulatory Visit: Payer: Self-pay | Admitting: Family Medicine

## 2021-04-23 DIAGNOSIS — M8580 Other specified disorders of bone density and structure, unspecified site: Secondary | ICD-10-CM

## 2021-04-23 DIAGNOSIS — Z1231 Encounter for screening mammogram for malignant neoplasm of breast: Secondary | ICD-10-CM

## 2021-05-31 ENCOUNTER — Ambulatory Visit
Admission: RE | Admit: 2021-05-31 | Discharge: 2021-05-31 | Disposition: A | Discharge status: Home / Self Care | Payer: Medicare HMO | Source: Ambulatory Visit | Attending: Family Medicine | Admitting: Family Medicine

## 2021-05-31 DIAGNOSIS — Z1231 Encounter for screening mammogram for malignant neoplasm of breast: Secondary | ICD-10-CM

## 2021-08-29 ENCOUNTER — Ambulatory Visit: Payer: Medicare HMO | Admitting: Dermatology

## 2021-08-29 DIAGNOSIS — L821 Other seborrheic keratosis: Secondary | ICD-10-CM

## 2021-08-29 DIAGNOSIS — Z85828 Personal history of other malignant neoplasm of skin: Secondary | ICD-10-CM

## 2021-08-29 DIAGNOSIS — L578 Other skin changes due to chronic exposure to nonionizing radiation: Secondary | ICD-10-CM | POA: Diagnosis not present

## 2021-08-29 DIAGNOSIS — L570 Actinic keratosis: Secondary | ICD-10-CM

## 2021-08-29 DIAGNOSIS — D692 Other nonthrombocytopenic purpura: Secondary | ICD-10-CM

## 2021-08-29 NOTE — Progress Notes (Signed)
   New Patient Visit  Subjective  Frances Phillips is a 81 y.o. female who presents for the following: Other (Dry spots of face). The patient has spots, moles and lesions to be evaluated, some may be new or changing and the patient has concerns that these could be cancer.  The following portions of the chart were reviewed this encounter and updated as appropriate:   Tobacco  Allergies  Meds  Problems  Med Hx  Surg Hx  Fam Hx     Review of Systems:  No other skin or systemic complaints except as noted in HPI or Assessment and Plan.  Objective  Well appearing patient in no apparent distress; mood and affect are within normal limits.  A focused examination was performed including face, arms, hands. Relevant physical exam findings are noted in the Assessment and Plan.  Left ear x 1, face x 1 (2) Erythematous thin papules/macules with gritty scale.    Assessment & Plan   Actinic Damage - chronic, secondary to cumulative UV radiation exposure/sun exposure over time - diffuse scaly erythematous macules with underlying dyspigmentation - Recommend daily broad spectrum sunscreen SPF 30+ to sun-exposed areas, reapply every 2 hours as needed.  - Recommend staying in the shade or wearing long sleeves, sun glasses (UVA+UVB protection) and wide brim hats (4-inch brim around the entire circumference of the hat). - Call for new or changing lesions.  History of Basal Cell Carcinoma of the Skin - No evidence of recurrence today - Recommend regular full body skin exams - Recommend daily broad spectrum sunscreen SPF 30+ to sun-exposed areas, reapply every 2 hours as needed.  - Call if any new or changing lesions are noted between office visits  Seborrheic Keratoses - Stuck-on, waxy, tan-brown papules and/or plaques  - Benign-appearing - Discussed benign etiology and prognosis. - Observe - Call for any changes  Purpura - Chronic; persistent and recurrent.  Treatable, but not curable. -  Violaceous macules and patches - Benign - Related to trauma, age, sun damage and/or use of blood thinners, chronic use of topical and/or oral steroids - Observe - Can use OTC arnica containing moisturizer such as Dermend Bruise Formula if desired - Call for worsening or other concerns  AK (actinic keratosis) (2) Left ear x 1, face x 1  RTC in 2 months if persistent  Destruction of lesion - Left ear x 1, face x 1 Complexity: simple   Destruction method: cryotherapy   Informed consent: discussed and consent obtained   Timeout:  patient name, date of birth, surgical site, and procedure verified Lesion destroyed using liquid nitrogen: Yes   Region frozen until ice ball extended beyond lesion: Yes   Outcome: patient tolerated procedure well with no complications   Post-procedure details: wound care instructions given     Return in about 1 year (around 08/30/2022).  I, Ashok Cordia, CMA, am acting as scribe for Sarina Ser, MD .  Documentation: I have reviewed the above documentation for accuracy and completeness, and I agree with the above.  Sarina Ser, MD

## 2021-08-29 NOTE — Patient Instructions (Addendum)
Cryotherapy Aftercare  Wash gently with soap and water everyday.   Apply Vaseline and Band-Aid daily until healed.  Return to clinic if not resolved after 2 months.    Due to recent changes in healthcare laws, you may see results of your pathology and/or laboratory studies on MyChart before the doctors have had a chance to review them. We understand that in some cases there may be results that are confusing or concerning to you. Please understand that not all results are received at the same time and often the doctors may need to interpret multiple results in order to provide you with the best plan of care or course of treatment. Therefore, we ask that you please give [place] 2 business days to thoroughly review all your results before contacting the office for clarification. Should we see a critical lab result, you will be contacted sooner.   If You Need Anything After Your Visit  If you have any questions or concerns for your doctor, please call our main line at [phone] and press option 4 to reach your doctor's medical assistant. If no one answers, please leave a voicemail as directed and we will return your call as soon as possible. Messages left after 4 pm will be answered the following business day.   You may also send [place] a message via [place]. We typically respond to MyChart messages within 1-2 business days.  For prescription refills, please ask your pharmacy to contact our office. Our fax number is [phone].  If you have an urgent issue when the clinic is closed that cannot wait until the next business day, you can page your doctor at the number below.    Please note that while we do our best to be available for urgent issues outside of office hours, we are not available 24/7.   If you have an urgent issue and are unable to reach [place], you may choose to seek medical care at your doctor's office, retail clinic, urgent care center, or emergency room.  If you have a medical emergency,  please immediately call 911 or go to the emergency department.  Pager Numbers  - Dr. Nehemiah Massed: [phone]  - Dr. Laurence Ferrari: [phone]  - Dr. Nicole Kindred: [phone]  In the event of inclement weather, please call our main line at [phone] for an update on the status of any delays or closures.  Dermatology Medication Tips: Please keep the boxes that topical medications come in in order to help keep track of the instructions about where and how to use these. Pharmacies typically print the medication instructions only on the boxes and not directly on the medication tubes.   If your medication is too expensive, please contact our office at [phone] option 4 or send [place] a message through [place].   We are unable to tell what your co-pay for medications will be in advance as this is different depending on your insurance coverage. However, we may be able to find a substitute medication at lower cost or fill out paperwork to get insurance to cover a needed medication.   If a prior authorization is required to get your medication covered by your insurance company, please allow [place] 1-2 business days to complete this process.  Drug prices often vary depending on where the prescription is filled and some pharmacies may offer cheaper prices.  The website www.goodrx.com contains coupons for medications through different pharmacies. The prices here do not account for what the cost may be with help from insurance (it may  be cheaper with your insurance), but the website can give you the price if you did not use any insurance.  - You can print the associated coupon and take it with your prescription to the pharmacy.  - You may also stop by our office during regular business hours and pick up a GoodRx coupon card.  - If you need your prescription sent electronically to a different pharmacy, notify our office through [facility] or by phone at [phone] option 4.     Si Usted Necesita Algo  Despus de Su Visita  Tambin puede enviarnos un mensaje a travs de Pharmacist, community. Por lo general respondemos a los mensajes de MyChart en el transcurso de 1 a 2 das hbiles.  Para renovar recetas, por favor pida a su farmacia que se ponga en contacto con nuestra oficina. Harland Dingwall de fax es [place] [phone].  Si tiene un asunto urgente cuando la clnica est cerrada y que no puede esperar hasta el siguiente da hbil, puede llamar/localizar a su doctor(a) al nmero que aparece a continuacin.   Por favor, tenga en cuenta que aunque hacemos todo lo posible para estar disponibles para asuntos urgentes fuera del horario de [place], no estamos disponibles las 24 horas del da, los 7 das de la [place].   Si tiene un problema urgente y no puede comunicarse con nosotros, puede optar por buscar atencin mdica  en el consultorio de su doctor(a), en una clnica privada, en un centro de atencin urgente o en una sala de emergencias.  Si tiene Engineering geologist, por favor llame inmediatamente al 911 o vaya a la sala de emergencias.  Nmeros de bper  - Dr. Nehemiah Massed: [phone]  - Dra. Moye: [phone]  - Dra. Nicole Kindred: [phone]  En caso de inclemencias del [place], por favor llame a Johnsie Kindred principal al [phone] para una actualizacin sobre el [place] de cualquier retraso o cierre.  Consejos para la medicacin en dermatologa: Por favor, guarde las cajas en las que vienen los medicamentos de uso tpico para ayudarle a seguir las instrucciones sobre dnde y cmo usarlos. Las farmacias generalmente imprimen las instrucciones del medicamento slo en las cajas y no directamente en los tubos del [place].   Si su medicamento es muy caro, por favor, pngase en contacto con Zigmund Daniel llamando al [phone] y presione la opcin 4 o envenos un mensaje a travs de Pharmacist, community.   No podemos decirle cul ser su copago por los medicamentos por adelantado ya que esto es diferente  dependiendo de la cobertura de su seguro. Sin embargo, es posible que podamos encontrar un medicamento sustituto a Electrical engineer un formulario para que el seguro cubra el medicamento que se considera necesario.   Si se requiere una autorizacin previa para que su compaa de seguros [place] su medicamento, por favor permtanos de 1 a 2 das hbiles para completar este proceso.  Los precios de los medicamentos varan con frecuencia dependiendo del Environmental consultant de dnde se surte la receta y alguna farmacias pueden ofrecer precios ms baratos.  El sitio web www.goodrx.com tiene cupones para medicamentos de Airline pilot. Los precios aqu no tienen en cuenta lo que podra costar con la ayuda del seguro (puede ser ms barato con su seguro), pero el sitio web puede darle el precio si no utiliz Research scientist (physical sciences).  - Puede imprimir el cupn correspondiente y llevarlo con su receta a la farmacia.  - Tambin puede pasar por nuestra oficina durante el horario de atencin regular y  recoger una tarjeta de cupones de GoodRx.  - Si necesita que su receta se enve electrnicamente a una farmacia diferente, informe a nuestra oficina a travs de MyChart de [place] o por telfono llamando al [phone] y presione la opcin 4.

## 2021-09-05 ENCOUNTER — Encounter: Payer: Self-pay | Admitting: Dermatology

## 2021-09-30 ENCOUNTER — Ambulatory Visit
Admission: EM | Admit: 2021-09-30 | Discharge: 2021-09-30 | Disposition: A | Discharge status: Home / Self Care | Payer: Medicare HMO | Attending: Emergency Medicine | Admitting: Emergency Medicine

## 2021-09-30 DIAGNOSIS — N390 Urinary tract infection, site not specified: Secondary | ICD-10-CM | POA: Insufficient documentation

## 2021-09-30 LAB — URINALYSIS, ROUTINE W REFLEX MICROSCOPIC
Bilirubin Urine: NEGATIVE
Glucose, UA: NEGATIVE mg/dL
Ketones, ur: NEGATIVE mg/dL
Nitrite: NEGATIVE
Protein, ur: NEGATIVE mg/dL
Specific Gravity, Urine: 1.01 (ref 1.005–1.030)
pH: 5.5 (ref 5.0–8.0)

## 2021-09-30 LAB — URINALYSIS, MICROSCOPIC (REFLEX): RBC / HPF: >50 RBC/hpf (ref 0–5)

## 2021-09-30 MED ORDER — NITROFURANTOIN MONOHYD MACRO 100 MG PO CAPS: 100.0000 mg | ORAL_CAPSULE | Freq: Two times a day (BID) | ORAL | 0 refills | Status: DC

## 2021-09-30 MED ORDER — PHENAZOPYRIDINE HCL 200 MG PO TABS: 200.0000 mg | ORAL_TABLET | Freq: Three times a day (TID) | ORAL | 0 refills | Status: DC

## 2021-09-30 NOTE — Discharge Instructions (Addendum)

## 2021-09-30 NOTE — ED Provider Notes (Signed)
[facility]    CSN: [id number] Arrival date & time: 09/30/21  1337      History   Chief Complaint Chief Complaint  Patient presents with   Dysuria   Urinary Frequency    HPI Frances Phillips is a 81 y.o. female.   HPI  81 year old female here for evaluation of urinary symptoms.  Patient reports when she woke up this morning she had burning with urination along with urinary frequency.  She has urgency of urination but states that that is present all the time.  This not associated with fever, low back pain, or abdominal pain.  Patient also denies any cloudiness or blood in her urine.  She states she does not typically get UTIs.  She denies any recent swimming, hot tub use, or sexual intercourse.  Past Medical History:  Diagnosis Date   Basal cell carcinoma 07/24/2015   Left medial canthus. Nodular pattern. Excised 08/15/2015, margins free.   Celiac disease    Hearing loss    Hypertension    Thyroid disease     There are no problems to display for this patient.   Past Surgical History:  Procedure Laterality Date   ABDOMINAL HYSTERECTOMY      OB History   No obstetric history on file.      Home Medications    Prior to Admission medications   Medication Sig Start Date End Date Taking? Authorizing Provider  nitrofurantoin, macrocrystal-monohydrate, (MACROBID) 100 MG capsule Take 1 capsule (100 mg total) by mouth 2 (two) times daily. 09/30/21  Yes Margarette Canada, NP  phenazopyridine (PYRIDIUM) 200 MG tablet Take 1 tablet (200 mg total) by mouth 3 (three) times daily. 09/30/21  Yes Margarette Canada, NP  albuterol (VENTOLIN HFA) 108 (90 Base) MCG/ACT inhaler Inhale 1-2 puffs into the lungs every 4 (four) hours as needed for wheezing or shortness of breath. 01/31/19   Karen Kitchens, NP  benzonatate (TESSALON) 100 MG capsule Take 2 capsules (200 mg total) by mouth every 8 (eight) hours. 03/03/21   Margarette Canada, NP  budesonide (ENTOCORT EC) 3 MG 24 hr capsule Take 9 mg  by mouth every morning. 12/27/20   [provider]  cyanocobalamin 1000 MCG tablet Take by mouth.    [provider]  diclofenac Sodium (VOLTAREN) 1 % GEL diclofenac 1 % topical gel  APPLY TWO GRAMS TO THE AFFECTED AREA(S) FOUR TIMES DAILY    [provider]  gabapentin (NEURONTIN) 100 MG capsule gabapentin 100 mg capsule    [provider]  ipratropium (ATROVENT) 0.06 % nasal spray Place 2 sprays into both nostrils 4 (four) times daily. 03/03/21   Margarette Canada, NP  levothyroxine (SYNTHROID, LEVOTHROID) 100 MCG tablet Take 100 mcg by mouth daily before breakfast.    [provider]  lisinopril (PRINIVIL,ZESTRIL) 40 MG tablet  05/23/16   [provider]  omeprazole (PRILOSEC) 20 MG capsule omeprazole 20 mg capsule,delayed release    [provider]  pravastatin (PRAVACHOL) 40 MG tablet  05/23/16   [provider]  venlafaxine (EFFEXOR) 37.5 MG tablet Take 37.5 mg by mouth 2 (two) times daily.    [provider]    Family History History reviewed. No pertinent family history.  Social History Social History   Tobacco Use   Smoking status: Never   Smokeless tobacco: Never  Vaping Use   Vaping Use: Never used  Substance Use Topics   Alcohol use: No   Drug use: No  Allergies   Gluten meal and Percocet [oxycodone-acetaminophen]   Review of Systems Review of Systems  Constitutional:  Negative for fever.  Gastrointestinal:  Negative for abdominal pain.  Genitourinary:  Positive for dysuria, frequency and urgency. Negative for hematuria.  Musculoskeletal:  Negative for back pain.  Hematological: Negative.   Psychiatric/Behavioral: Negative.       Physical Exam Triage Vital Signs ED Triage Vitals  Enc Vitals Group     BP 09/30/21 1351 118/63     Pulse Rate 09/30/21 1351 64     Resp --      Temp 09/30/21 1351 98 F (36.7 C)     Temp Source 09/30/21 1351 Oral     SpO2 09/30/21 1351 99 %     Weight  09/30/21 1349 140 lb (63.5 kg)     Height 09/30/21 1349 '5\' 1"'$  (1.549 m)     Head Circumference --      Peak Flow --      Pain Score 09/30/21 1348 10     Pain Loc --      Pain Edu? --      Excl. in [place]? --    No data found.  Updated Vital Signs BP 118/63 (BP Location: Left Arm)   Pulse 64   Temp 98 F (36.7 C) (Oral)   Ht '5\' 1"'$  (1.549 m)   Wt 140 lb (63.5 kg)   SpO2 99%   BMI 26.45 kg/m   Visual Acuity Right Eye Distance:   Left Eye Distance:   Bilateral Distance:    Right Eye Near:   Left Eye Near:    Bilateral Near:     Physical Exam Vitals and nursing note reviewed.  Constitutional:      Appearance: Normal appearance. She is not ill-appearing.  HENT:     Head: Normocephalic and atraumatic.  Cardiovascular:     Rate and Rhythm: Normal rate and regular rhythm.     Pulses: Normal pulses.     Heart sounds: Normal heart sounds. No murmur heard.    No friction rub. No gallop.  Pulmonary:     Effort: Pulmonary effort is normal.     Breath sounds: Normal breath sounds. No wheezing, rhonchi or rales.  Abdominal:     General: Abdomen is flat.     Palpations: Abdomen is soft.     Tenderness: There is no abdominal tenderness. There is no right CVA tenderness, left CVA tenderness, guarding or rebound.  Skin:    General: Skin is warm and dry.     Capillary Refill: Capillary refill takes less than 2 seconds.     Findings: No erythema or rash.  Neurological:     General: No focal deficit present.     Mental Status: She is alert and oriented to person, place, and time.  Psychiatric:        Mood and Affect: Mood normal.        Behavior: Behavior normal.        Thought Content: Thought content normal.        Judgment: Judgment normal.      UC Treatments / Results  Labs (all labs ordered are listed, but only abnormal results are displayed) Labs Reviewed  URINALYSIS, ROUTINE W REFLEX MICROSCOPIC - Abnormal; Notable for the following components:      Result Value    APPearance CLOUDY (*)    Hgb urine dipstick MODERATE (*)    Leukocytes,Ua LARGE (*)    All other components within normal  limits  URINALYSIS, MICROSCOPIC (REFLEX) - Abnormal; Notable for the following components:   Bacteria, UA RARE (*)    All other components within normal limits  URINE CULTURE    EKG   Radiology No results found.  Procedures Procedures (including critical care time)  Medications Ordered in UC Medications - No data to display  Initial Impression / Assessment and Plan / UC Course  I have reviewed the triage vital signs and the nursing notes.  Pertinent labs & imaging results that were available during my care of the patient were reviewed by me and considered in my medical decision making (see chart for details).   Patient is a very pleasant, nontoxic-appearing 81 year old female here for evaluation of urinary complaints outlined HPI above.  Her physical exam reveals a benign cardiopulmonary exam with S1-S2 heart sounds reflecting regular rate and rhythm and lung sounds that are clear to auscultation all fields.  No CVA tenderness on exam.  Abdomen is soft, flat, and nontender.  Will order UA to look for the presence of UTI.  Urinalysis is cloudy in appearance with moderate hemoglobin and large leukocyte esterase.  Negative for nitrites or protein.  Specific gravity is normal at 1.010.  Reflex microscopy shows greater than 50 RBCs, rare bacteria, 6-10 WBCs, and WBC clumps.  I will send urine for culture.  I will discharge patient home on Macrobid twice daily for 5 days for treatment of UTI and will provide a prescription for Pyridium to help with urinary discomfort.  I will also culture her urine and we can change antibiotic therapy based on culture results if necessary.   Final Clinical Impressions(s) / UC Diagnoses   Final diagnoses:  Lower urinary tract infectious disease     Discharge Instructions      Take the Macrobid twice daily for 5 days with food  for treatment of urinary tract infection.  Use the Pyridium every 8 hours as needed for urinary discomfort.  This will turn your urine a bright red-orange.  Increase your oral fluid intake so that you increase your urine production and or flushing your urinary system.  Take an over-the-counter probiotic, such as Culturelle-Align-Activia, 1 hour after each dose of antibiotic to prevent diarrhea or yeast infections from forming.  We will culture urine and change the antibiotics if necessary.  Return for reevaluation, or see your primary care provider, for any new or worsening symptoms.      ED Prescriptions     Medication Sig Dispense Auth. Provider   nitrofurantoin, macrocrystal-monohydrate, (MACROBID) 100 MG capsule Take 1 capsule (100 mg total) by mouth 2 (two) times daily. 10 capsule Margarette Canada, NP   phenazopyridine (PYRIDIUM) 200 MG tablet Take 1 tablet (200 mg total) by mouth 3 (three) times daily. 6 tablet Margarette Canada, NP      PDMP not reviewed this encounter.   Margarette Canada, NP 09/30/21 1429

## 2021-09-30 NOTE — ED Triage Notes (Signed)
Patient reports that when she woke up this morning and went to urinate it burned.   Patient reports urinary frequency.

## 2021-10-01 LAB — URINE CULTURE: Culture: NO GROWTH

## 2021-11-15 ENCOUNTER — Ambulatory Visit: Payer: Medicare Other | Admitting: Podiatry

## 2022-02-20 ENCOUNTER — Ambulatory Visit
Admission: EM | Admit: 2022-02-20 | Discharge: 2022-02-20 | Disposition: A | Discharge status: Home / Self Care | Payer: Medicare HMO | Attending: Emergency Medicine | Admitting: Emergency Medicine

## 2022-02-20 DIAGNOSIS — B349 Viral infection, unspecified: Secondary | ICD-10-CM | POA: Diagnosis not present

## 2022-02-20 DIAGNOSIS — J040 Acute laryngitis: Secondary | ICD-10-CM | POA: Diagnosis not present

## 2022-02-20 MED ORDER — OSELTAMIVIR PHOSPHATE 75 MG PO CAPS: 75.0000 mg | ORAL_CAPSULE | Freq: Every day | ORAL | 0 refills | Status: DC

## 2022-02-20 MED ORDER — PREDNISONE 20 MG PO TABS: 40.0000 mg | ORAL_TABLET | Freq: Every day | ORAL | 0 refills | Status: DC

## 2022-02-20 NOTE — Discharge Instructions (Signed)
Today you are being treated for a viral illness, low suspicion for influenza as you have had no fevers however due to your exposure will provide Tamiflu to keep illness away, you are currently experiencing laryngitis which is irritation to the vocal cords due to a germ which in this case is most likely a virus  Your symptoms today are most likely being caused by a virus and should steadily improve in time it can take up to 7 to 10 days before you truly start to see a turnaround however things will get better  Begin Tamiflu every morning for 10 days  Begin prednisone every morning for 5 days, this medicine helps to reduce inflammation and irritation and ideally will give you relief    You can take Tylenol and/or Ibuprofen as needed for fever reduction and pain relief.   For cough: honey 1/2 to 1 teaspoon (you can dilute the honey in water or another fluid).  You can also use guaifenesin and dextromethorphan for cough. You can use a humidifier for chest congestion and cough.  If you don't have a humidifier, you can sit in the bathroom with the hot shower running.      For sore throat: try warm salt water gargles, cepacol lozenges, throat spray, warm tea or water with lemon/honey, popsicles or ice, or OTC cold relief medicine for throat discomfort.   For congestion: take a daily anti-histamine like Zyrtec, Claritin, and a oral decongestant, such as pseudoephedrine.  You can also use Flonase 1-2 sprays in each nostril daily.   It is important to stay hydrated: drink plenty of fluids (water, gatorade/powerade/pedialyte, juices, or teas) to keep your throat moisturized and help further relieve irritation/discomfort.

## 2022-02-20 NOTE — ED Provider Notes (Signed)
[facility]    CSN: [id number] Arrival date & time: 02/20/22  1356      History   Chief Complaint Chief Complaint  Patient presents with   Laryngitis         HPI Frances Phillips is a 82 y.o. female.   Patient presents for evaluation of loss of voice, mild sore throat, mild nonproductive cough and mild right-sided ear pain beginning 2 days ago.  Tolerating food and liquids.  Has attempted use of pseudoephedrine which has been ineffective.  Known exposure to influenza within 2 weeks.  Denies fever, chills, body aches, congestion, shortness of breath or wheezing.   Past Medical History:  Diagnosis Date   Basal cell carcinoma 07/24/2015   Left medial canthus. Nodular pattern. Excised 08/15/2015, margins free.   Celiac disease    Hearing loss    Hypertension    Thyroid disease     There are no problems to display for this patient.   Past Surgical History:  Procedure Laterality Date   ABDOMINAL HYSTERECTOMY      OB History   No obstetric history on file.      Home Medications    Prior to Admission medications   Medication Sig Start Date End Date Taking? Authorizing Provider  albuterol (VENTOLIN HFA) 108 (90 Base) MCG/ACT inhaler Inhale 1-2 puffs into the lungs every 4 (four) hours as needed for wheezing or shortness of breath. 01/31/19  Yes Karen Kitchens, NP  budesonide (ENTOCORT EC) 3 MG 24 hr capsule Take 9 mg by mouth every morning. 12/27/20  Yes [provider]  cyanocobalamin 1000 MCG tablet Take by mouth.   Yes [provider]  gabapentin (NEURONTIN) 100 MG capsule gabapentin 100 mg capsule   Yes [provider]  ipratropium (ATROVENT) 0.06 % nasal spray Place 2 sprays into both nostrils 4 (four) times daily. 03/03/21  Yes Margarette Canada, NP  levothyroxine (SYNTHROID, LEVOTHROID) 100 MCG tablet Take 100 mcg by mouth daily before breakfast.   Yes [provider]  lisinopril (PRINIVIL,ZESTRIL) 40 MG tablet  05/23/16  Yes  [provider]  omeprazole (PRILOSEC) 20 MG capsule omeprazole 20 mg capsule,delayed release   Yes [provider]  phenazopyridine (PYRIDIUM) 200 MG tablet Take 1 tablet (200 mg total) by mouth 3 (three) times daily. 09/30/21  Yes Margarette Canada, NP  pravastatin (PRAVACHOL) 40 MG tablet  05/23/16  Yes [provider]  venlafaxine (EFFEXOR) 37.5 MG tablet Take 37.5 mg by mouth 2 (two) times daily.   Yes [provider]  benzonatate (TESSALON) 100 MG capsule Take 2 capsules (200 mg total) by mouth every 8 (eight) hours. 03/03/21   Margarette Canada, NP  diclofenac Sodium (VOLTAREN) 1 % GEL diclofenac 1 % topical gel  APPLY TWO GRAMS TO THE AFFECTED AREA(S) FOUR TIMES DAILY    [provider]  nitrofurantoin, macrocrystal-monohydrate, (MACROBID) 100 MG capsule Take 1 capsule (100 mg total) by mouth 2 (two) times daily. 09/30/21   Margarette Canada, NP    Family History History reviewed. No pertinent family history.  Social History Social History   Tobacco Use   Smoking status: Never   Smokeless tobacco: Never  Vaping Use   Vaping Use: Never used  Substance Use Topics   Alcohol use: No   Drug use: No     Allergies   Gluten meal and Percocet [oxycodone-acetaminophen]   Review of Systems Review of Systems  Constitutional: Negative.   HENT:  Positive for ear pain,  sore throat and voice change. Negative for congestion, dental problem, drooling, ear discharge, facial swelling, hearing loss, mouth sores, nosebleeds, postnasal drip, rhinorrhea, sinus pressure, sinus pain, sneezing, tinnitus and trouble swallowing.   Respiratory:  Positive for cough. Negative for apnea, choking, chest tightness, shortness of breath and stridor.   Cardiovascular: Negative.   Skin: Negative.   Neurological: Negative.      Physical Exam Triage Vital Signs ED Triage Vitals  Enc Vitals Group     BP 02/20/22 1504 132/64     Pulse Rate 02/20/22 1504 (!) 105     Resp  02/20/22 1504 18     Temp 02/20/22 1504 98.9 F (37.2 C)     Temp Source 02/20/22 1504 Oral     SpO2 02/20/22 1504 100 %     Weight 02/20/22 1502 140 lb (63.5 kg)     Height 02/20/22 1502 '5\' 1"'$  (1.549 m)     Head Circumference --      Peak Flow --      Pain Score 02/20/22 1502 0     Pain Loc --      Pain Edu? --      Excl. in [place]? --    No data found.  Updated Vital Signs BP 132/64 (BP Location: Left Arm)   Pulse (!) 105   Temp 98.9 F (37.2 C) (Oral)   Resp 18   Ht '5\' 1"'$  (1.549 m)   Wt 140 lb (63.5 kg)   SpO2 100%   BMI 26.45 kg/m   Visual Acuity Right Eye Distance:   Left Eye Distance:   Bilateral Distance:    Right Eye Near:   Left Eye Near:    Bilateral Near:     Physical Exam Constitutional:      Appearance: Normal appearance.  HENT:     Head: Normocephalic.     Right Ear: Tympanic membrane, ear canal and external ear normal.     Left Ear: Tympanic membrane, ear canal and external ear normal.     Nose: Nose normal.     Mouth/Throat:     Mouth: Mucous membranes are moist.     Pharynx: No posterior oropharyngeal erythema.  Eyes:     Extraocular Movements: Extraocular movements intact.  Cardiovascular:     Rate and Rhythm: Normal rate and regular rhythm.     Pulses: Normal pulses.     Heart sounds: Normal heart sounds.  Pulmonary:     Effort: Pulmonary effort is normal.     Breath sounds: Normal breath sounds.  Skin:    General: Skin is warm and dry.  Neurological:     Mental Status: She is alert and oriented to person, place, and time. Mental status is at baseline.  Psychiatric:        Mood and Affect: Mood normal.        Behavior: Behavior normal.      UC Treatments / Results  Labs (all labs ordered are listed, but only abnormal results are displayed) Labs Reviewed - No data to display  EKG   Radiology No results found.  Procedures Procedures (including critical care time)  Medications Ordered in UC Medications - No data to  display  Initial Impression / Assessment and Plan / UC Course  I have reviewed the triage vital signs and the nursing notes.  Pertinent labs & imaging results that were available during my care of the patient were reviewed by me and considered in my medical decision making (see chart  for details).  Viral illness, pharyngitis  Vitals are stable patient is in no signs of distress nontoxic-appearing, no erythema is noted to the oropharynx, without tonsillar adenopathy or exudate, discussed with patient, declined COVID testing, will defer flu testing as patient has not been experiencing any fevers, will provide prophylactic treatment with use of Tamiflu, prescribed prednisone admitted supportive measures for remaining symptoms, may follow-up with his urgent care as needed if symptoms persist or worsen Final Clinical Impressions(s) / UC Diagnoses   Final diagnoses:  None   Discharge Instructions   None    ED Prescriptions   None    PDMP not reviewed this encounter.   Hans Eden, NP 02/20/22 1606

## 2022-02-20 NOTE — ED Triage Notes (Signed)
Pt c/o loss of voice, right ear pain, fatigue x2days  Pt states she has had a slight sore throat.  Pt has been around he daughter who had the flu.   Pt denies any headache, congestion, or fever.

## 2022-08-01 ENCOUNTER — Other Ambulatory Visit: Payer: Self-pay | Admitting: Family Medicine

## 2022-08-01 DIAGNOSIS — M8580 Other specified disorders of bone density and structure, unspecified site: Secondary | ICD-10-CM

## 2022-08-07 ENCOUNTER — Ambulatory Visit
Admission: EM | Admit: 2022-08-07 | Discharge: 2022-08-07 | Disposition: A | Discharge status: Home / Self Care | Payer: Medicare HMO | Attending: Family Medicine | Admitting: Family Medicine

## 2022-08-07 ENCOUNTER — Encounter: Payer: Self-pay | Admitting: Emergency Medicine

## 2022-08-07 DIAGNOSIS — N3001 Acute cystitis with hematuria: Secondary | ICD-10-CM | POA: Insufficient documentation

## 2022-08-07 LAB — URINALYSIS, W/ REFLEX TO CULTURE (INFECTION SUSPECTED)
Bilirubin Urine: NEGATIVE
Glucose, UA: NEGATIVE mg/dL
Ketones, ur: NEGATIVE mg/dL
Nitrite: NEGATIVE
Protein, ur: NEGATIVE mg/dL
Specific Gravity, Urine: 1.01 (ref 1.005–1.030)
WBC, UA: >50 WBC/hpf (ref 0–5)
pH: 5.5 (ref 5.0–8.0)

## 2022-08-07 MED ORDER — NITROFURANTOIN MONOHYD MACRO 100 MG PO CAPS: 100.0000 mg | ORAL_CAPSULE | Freq: Two times a day (BID) | ORAL | 0 refills | Status: DC

## 2022-08-07 NOTE — ED Triage Notes (Addendum)
Pt presents with dysuria and back pain that started this morning, Pt has taken 2 Azos for relief.

## 2022-08-07 NOTE — ED Provider Notes (Signed)
[facility]    CSN: [id number] Arrival date & time: 08/07/22  1443      History   Chief Complaint Chief Complaint  Patient presents with   Dysuria     HPI HPI Frances Phillips is a 82 y.o. female.    Frances Phillips presents for burning with urination and back pain that started this morning.   Took AZO prior to arrival. Has not had any antibiotics in last 30 days.    - Abnormal vaginal discharge: no - vaginal bleeding: no - Dysuria: yes - Hematuria: no - Urinary urgency:not new  - Urinary frequency: no  - Fever: no - Abdominal pain: no  - Pelvic pain: no - Rash/Skin lesions/mouth ulcers: no - Nausea: no  - Vomiting: no  - Back Pain: yes       Past Medical History:  Diagnosis Date   Basal cell carcinoma 07/24/2015   Left medial canthus. Nodular pattern. Excised 08/15/2015, margins free.   Celiac disease    Hearing loss    Hypertension    Thyroid disease     There are no problems to display for this patient.   Past Surgical History:  Procedure Laterality Date   ABDOMINAL HYSTERECTOMY      OB History   No obstetric history on file.      Home Medications    Prior to Admission medications   Medication Sig Start Date End Date Taking? Authorizing Provider  albuterol (VENTOLIN HFA) 108 (90 Base) MCG/ACT inhaler Inhale 1-2 puffs into the lungs every 4 (four) hours as needed for wheezing or shortness of breath. 01/31/19   Verlee Monte, NP  benzonatate (TESSALON) 100 MG capsule Take 2 capsules (200 mg total) by mouth every 8 (eight) hours. 03/03/21   Becky Augusta, NP  budesonide (ENTOCORT EC) 3 MG 24 hr capsule Take 9 mg by mouth every morning. 12/27/20   [provider]  cyanocobalamin 1000 MCG tablet Take by mouth.    [provider]  diclofenac Sodium (VOLTAREN) 1 % GEL diclofenac 1 % topical gel  APPLY TWO GRAMS TO THE AFFECTED AREA(S) FOUR TIMES DAILY    [provider]  gabapentin (NEURONTIN) 100 MG capsule  gabapentin 100 mg capsule    [provider]  ipratropium (ATROVENT) 0.06 % nasal spray Place 2 sprays into both nostrils 4 (four) times daily. 03/03/21   Becky Augusta, NP  levothyroxine (SYNTHROID, LEVOTHROID) 100 MCG tablet Take 100 mcg by mouth daily before breakfast.    [provider]  lisinopril (PRINIVIL,ZESTRIL) 40 MG tablet  05/23/16   [provider]  nitrofurantoin, macrocrystal-monohydrate, (MACROBID) 100 MG capsule Take 1 capsule (100 mg total) by mouth 2 (two) times daily. 08/07/22   [name], DO  omeprazole (PRILOSEC) 20 MG capsule omeprazole 20 mg capsule,delayed release    [provider]  oseltamivir (TAMIFLU) 75 MG capsule Take 1 capsule (75 mg total) by mouth daily. 02/20/22   White, [name], NP  phenazopyridine (PYRIDIUM) 200 MG tablet Take 1 tablet (200 mg total) by mouth 3 (three) times daily. 09/30/21   Becky Augusta, NP  pravastatin (PRAVACHOL) 40 MG tablet  05/23/16   [provider]  predniSONE (DELTASONE) 20 MG tablet Take 2 tablets (40 mg total) by mouth daily. 02/20/22   [name], NP  venlafaxine (EFFEXOR) 37.5 MG tablet Take 37.5 mg by mouth 2 (two) times daily.    [provider]    Family History History reviewed. No  pertinent family history.  Social History Social History   Tobacco Use   Smoking status: Never   Smokeless tobacco: Never  Vaping Use   Vaping Use: Never used  Substance Use Topics   Alcohol use: No   Drug use: No     Allergies   Gluten meal and Percocet [oxycodone-acetaminophen]   Review of Systems Review of Systems: :negative unless otherwise stated in HPI.      Physical Exam Triage Vital Signs ED Triage Vitals  Enc Vitals Group     BP 08/07/22 1541 (!) 157/68     Pulse Rate 08/07/22 1541 62     Resp 08/07/22 1541 16     Temp 08/07/22 1541 98.2 F (36.8 C)     Temp Source 08/07/22 1541 Oral     SpO2 08/07/22 1541 97 %     Weight --      Height --      Head  Circumference --      Peak Flow --      Pain Score 08/07/22 1540 5     Pain Loc --      Pain Edu? --      Excl. in [place]? --    No data found.  Updated Vital Signs BP (!) 157/68 (BP Location: Left Arm)   Pulse 62   Temp 98.2 F (36.8 C) (Oral)   Resp 16   SpO2 97%   Visual Acuity Right Eye Distance:   Left Eye Distance:   Bilateral Distance:    Right Eye Near:   Left Eye Near:    Bilateral Near:     Physical Exam GEN: well appearing female in no acute distress  CVS: well perfused  RESP: speaking in full sentences without pause    UC Treatments / Results  Labs (all labs ordered are listed, but only abnormal results are displayed) Labs Reviewed  URINALYSIS, W/ REFLEX TO CULTURE (INFECTION SUSPECTED) - Abnormal; Notable for the following components:      Result Value   APPearance HAZY (*)    Hgb urine dipstick MODERATE (*)    Leukocytes,Ua MODERATE (*)    Bacteria, UA FEW (*)    All other components within normal limits  URINE CULTURE    EKG   Radiology No results found.  Procedures Procedures (including critical care time)  Medications Ordered in UC Medications - No data to display  Initial Impression / Assessment and Plan / UC Course  I have reviewed the triage vital signs and the nursing notes.  Pertinent labs & imaging results that were available during my care of the patient were reviewed by me and considered in my medical decision making (see chart for details).     Acute cystitis:  Patient is a 81 y.o. female  who presents for 1 day of dysuria and urinary urgency.  Overall patient is well-appearing and afebrile.  Vital signs stable.  UA consistent with acute cystitis.  Hematuria supported on microscopy. Treat with Macrobid 2 times daily for 5 days.  Urine culture obtained.  Follow-up sensitivities and change antibiotics, if needed.   Return precautions including abdominal pain, fever, chills, nausea, or vomiting given. Follow-up,  if symptoms not  improving or getting worse. Discussed MDM, treatment plan and plan for follow-up with patient who agrees with plan.        Final Clinical Impressions(s) / UC Diagnoses   Final diagnoses:  Acute cystitis with hematuria     Discharge Instructions  Stop by the pharmacy to pick up your prescriptions.  Follow up with your primary care provider as needed.      ED Prescriptions     Medication Sig Dispense Auth. Provider   nitrofurantoin, macrocrystal-monohydrate, (MACROBID) 100 MG capsule Take 1 capsule (100 mg total) by mouth 2 (two) times daily. 10 capsule [name], DO      PDMP not reviewed this encounter.   [name], DO 08/07/22 1615

## 2022-08-07 NOTE — Discharge Instructions (Signed)
Stop by the pharmacy to pick up your prescriptions.  Follow up with your primary care provider as needed.  

## 2022-08-08 LAB — URINE CULTURE: Culture: NO GROWTH

## 2022-09-04 ENCOUNTER — Ambulatory Visit: Payer: Medicare HMO | Admitting: Dermatology

## 2022-09-23 ENCOUNTER — Ambulatory Visit: Payer: Medicare HMO | Admitting: Dermatology

## 2022-09-23 ENCOUNTER — Encounter: Payer: Self-pay | Admitting: Dermatology

## 2022-09-23 DIAGNOSIS — L570 Actinic keratosis: Secondary | ICD-10-CM

## 2022-09-23 DIAGNOSIS — D220 Melanocytic nevi of lip: Secondary | ICD-10-CM

## 2022-09-23 DIAGNOSIS — W908XXA Exposure to other nonionizing radiation, initial encounter: Secondary | ICD-10-CM

## 2022-09-23 DIAGNOSIS — Z85828 Personal history of other malignant neoplasm of skin: Secondary | ICD-10-CM

## 2022-09-23 DIAGNOSIS — L578 Other skin changes due to chronic exposure to nonionizing radiation: Secondary | ICD-10-CM | POA: Diagnosis not present

## 2022-09-23 DIAGNOSIS — D485 Neoplasm of uncertain behavior of skin: Secondary | ICD-10-CM

## 2022-09-23 NOTE — Patient Instructions (Addendum)
Cryotherapy Aftercare  Wash gently with soap and water everyday.   Apply Vaseline and Band-Aid daily until healed.   Wound Care Instructions  Cleanse wound gently with soap and water once a day then pat dry with clean gauze. Apply a thin coat of Petrolatum (petroleum jelly, "Vaseline") over the wound (unless you have an allergy to this). We recommend that you use a new, sterile tube of Vaseline. Do not pick or remove scabs. Do not remove the yellow or white "healing tissue" from the base of the wound.  Cover the wound with fresh, clean, nonstick gauze and secure with paper tape. You may use Band-Aids in place of gauze and tape if the wound is small enough, but would recommend trimming much of the tape off as there is often too much. Sometimes Band-Aids can irritate the skin.  You should call the office for your biopsy report after 1 week if you have not already been contacted.  If you experience any problems, such as abnormal amounts of bleeding, swelling, significant bruising, significant pain, or evidence of infection, please call the office immediately.  FOR ADULT SURGERY PATIENTS: If you need something for pain relief you may take 1 extra strength Tylenol (acetaminophen) AND 2 Ibuprofen (200mg  each) together every 4 hours as needed for pain. (do not take these if you are allergic to them or if you have a reason you should not take them.) Typically, you may only need pain medication for 1 to 3 days.    Due to recent changes in healthcare laws, you may see results of your pathology and/or laboratory studies on MyChart before the doctors have had a chance to review them. We understand that in some cases there may be results that are confusing or concerning to you. Please understand that not all results are received at the same time and often the doctors may need to interpret multiple results in order to provide you with the best plan of care or course of treatment. Therefore, we ask that you  please give [place] 2 business days to thoroughly review all your results before contacting the office for clarification. Should we see a critical lab result, you will be contacted sooner.   If You Need Anything After Your Visit  If you have any questions or concerns for your doctor, please call our main line at [phone] and press option 4 to reach your doctor's medical assistant. If no one answers, please leave a voicemail as directed and we will return your call as soon as possible. Messages left after 4 pm will be answered the following business day.   You may also send [place] a message via MyChart. We typically respond to MyChart messages within 1-2 business days.  For prescription refills, please ask your pharmacy to contact our office. Our fax number is [phone].  If you have an urgent issue when the clinic is closed that cannot wait until the next business day, you can page your doctor at the number below.    Please note that while we do our best to be available for urgent issues outside of office hours, we are not available 24/7.   If you have an urgent issue and are unable to reach [place], you may choose to seek medical care at your doctor's office, retail clinic, urgent care center, or emergency room.  If you have a medical emergency, please immediately call 911 or go to the emergency department.  Pager Numbers  - Dr. Gwen Pounds: [phone]  - Dr.  Roseanne Reno: [phone]  In the event of inclement weather, please call our main line at [phone] for an update on the status of any delays or closures.  Dermatology Medication Tips: Please keep the boxes that topical medications come in in order to help keep track of the instructions about where and how to use these. Pharmacies typically print the medication instructions only on the boxes and not directly on the medication tubes.   If your medication is too expensive, please contact our office at [phone] option 4 or send [place] a  message through MyChart.   We are unable to tell what your co-pay for medications will be in advance as this is different depending on your insurance coverage. However, we may be able to find a substitute medication at lower cost or fill out paperwork to get insurance to cover a needed medication.   If a prior authorization is required to get your medication covered by your insurance company, please allow [place] 1-2 business days to complete this process.  Drug prices often vary depending on where the prescription is filled and some pharmacies may offer cheaper prices.  The website www.goodrx.com contains coupons for medications through different pharmacies. The prices here do not account for what the cost may be with help from insurance (it may be cheaper with your insurance), but the website can give you the price if you did not use any insurance.  - You can print the associated coupon and take it with your prescription to the pharmacy.  - You may also stop by our office during regular business hours and pick up a GoodRx coupon card.  - If you need your prescription sent electronically to a different pharmacy, notify our office through [facility] or by phone at [phone] option 4.     Si Usted Necesita Algo Despus de Su Visita  Tambin puede enviarnos un mensaje a travs de Clinical cytogeneticist. Por lo general respondemos a los mensajes de MyChart en el transcurso de 1 a 2 das hbiles.  Para renovar recetas, por favor pida a su farmacia que se ponga en contacto con nuestra oficina. Annie Sable de fax es [place] [phone].  Si tiene un asunto urgente cuando la clnica est cerrada y que no puede esperar hasta el siguiente da hbil, puede llamar/localizar a su doctor(a) al nmero que aparece a continuacin.   Por favor, tenga en cuenta que aunque hacemos todo lo posible para estar disponibles para asuntos urgentes fuera del horario de [place], no estamos disponibles las 24 horas del da, los 7  [address]  [address] de la [place].   Si tiene un problema urgente y no puede comunicarse con nosotros, puede optar por buscar atencin mdica  en el consultorio de su doctor(a), en una clnica privada, en un centro de atencin urgente o en una sala de emergencias.  Si tiene Engineer, drilling, por favor llame inmediatamente al 911 o vaya a la sala de emergencias.  Nmeros de bper  - Dr. Gwen Pounds: [phone]  - Dra. Roseanne Reno: [phone]  En caso de inclemencias del [place], por favor llame a Lacy Duverney principal al [phone] para una actualizacin sobre el [place] de cualquier retraso o cierre.  Consejos para la medicacin en dermatologa: Por favor, guarde las cajas en las que vienen los medicamentos de uso tpico para ayudarle a seguir las instrucciones sobre dnde y cmo usarlos. Las farmacias generalmente imprimen las instrucciones del medicamento slo en las cajas y no directamente en los tubos del [place].   Si su  medicamento es [place] caro, por favor, pngase en contacto con Rolm Gala llamando al [phone] y presione la opcin 4 o envenos un mensaje a travs de Clinical cytogeneticist.   No podemos decirle cul ser su copago por los medicamentos por adelantado ya que esto es diferente dependiendo de la cobertura de su seguro. Sin embargo, es posible que podamos encontrar un medicamento sustituto a Audiological scientist un formulario para que el seguro cubra el medicamento que se considera necesario.   Si se requiere una autorizacin previa para que su compaa de seguros [place] su medicamento, por favor permtanos de 1 a 2 das hbiles para completar [address]  Los precios de los medicamentos varan con frecuencia dependiendo del Environmental consultant de dnde se surte la receta y alguna farmacias pueden ofrecer precios ms baratos.  El sitio web www.goodrx.com tiene cupones para medicamentos de Health and safety inspector. Los precios aqu no tienen en cuenta lo que podra costar con la ayuda del seguro (puede ser  ms barato con su seguro), pero el sitio web puede darle el precio si no utiliz Tourist information centre manager.  - Puede imprimir el cupn correspondiente y llevarlo con su receta a la farmacia.  - Tambin puede pasar por nuestra oficina durante el horario de atencin regular y Education officer, museum una tarjeta de cupones de GoodRx.  - Si necesita que su receta se enve electrnicamente a una farmacia diferente, informe a nuestra oficina a travs de MyChart de [place] o por telfono llamando al [phone] y presione la opcin 4.

## 2022-09-23 NOTE — Progress Notes (Unsigned)
Follow-Up Visit   Subjective  Frances Phillips is a 82 y.o. female who presents for the following: AK follow up of left ear and face x 2 treated with LN2. Patient has a history of BCC of left medial canthus. She also has a spot on her right upper lip that she would like checked. She declines TBSE today.  Accompanied by husband.  The patient has spots, moles and lesions to be evaluated, some may be new or changing and the patient may have concern these could be cancer.   The following portions of the chart were reviewed this encounter and updated as appropriate: medications, allergies, medical history  Review of Systems:  No other skin or systemic complaints except as noted in HPI or Assessment and Plan.  Objective  Well appearing patient in no apparent distress; mood and affect are within normal limits.  A focused examination was performed of the following areas: Face  Relevant exam findings are noted in the Assessment and Plan.  Right Lip/Oral Commissure 1.1 cm flesh colored papule         Assessment & Plan   HISTORY OF BASAL CELL CARCINOMA OF THE SKIN - No evidence of recurrence today - Recommend regular full body skin exams - Recommend daily broad spectrum sunscreen SPF 30+ to sun-exposed areas, reapply every 2 hours as needed.  - Call if any new or changing lesions are noted between office visits  ACTINIC DAMAGE - chronic, secondary to cumulative UV radiation exposure/sun exposure over time - diffuse scaly erythematous macules with underlying dyspigmentation - Recommend daily broad spectrum sunscreen SPF 30+ to sun-exposed areas, reapply every 2 hours as needed.  - Recommend staying in the shade or wearing long sleeves, sun glasses (UVA+UVB protection) and wide brim hats (4-inch brim around the entire circumference of the hat). - Call for new or changing lesions.   AK (actinic keratosis) (14) Right ear x 2, right infraorbital x 1, right sideburn x 1, hands x  10  Destruction of lesion - Right ear x 2, right infraorbital x 1, right sideburn x 1, hands x 10 (14) Complexity: simple   Destruction method: cryotherapy   Informed consent: discussed and consent obtained   Timeout:  patient name, date of birth, surgical site, and procedure verified Lesion destroyed using liquid nitrogen: Yes   Region frozen until ice ball extended beyond lesion: Yes   Outcome: patient tolerated procedure well with no complications   Post-procedure details: wound care instructions given    Neoplasm of uncertain behavior of skin Right Lip/Oral Commissure  Epidermal / dermal shaving  Lesion diameter (cm):  1.1 Informed consent: discussed and consent obtained   Timeout: patient name, date of birth, surgical site, and procedure verified   Procedure prep:  Patient was prepped and draped in usual sterile fashion Prep type:  Isopropyl alcohol Anesthesia: the lesion was anesthetized in a standard fashion   Anesthetic:  1% lidocaine w/ epinephrine 1-100,000 buffered w/ 8.4% NaHCO3 Instrument used: flexible razor blade   Hemostasis achieved with: pressure, aluminum chloride and electrodesiccation   Outcome: patient tolerated procedure well   Post-procedure details: sterile dressing applied and wound care instructions given   Dressing type: bandage and petrolatum    Specimen 1 - Surgical pathology Differential Diagnosis: Nevus R/O dysplasia Check Margins: No  Benign appearing on exam today. Recommend observation.Discussed shave removal. Advised patient not likely covered by insurance due to benign nature. Advised patient fee would be $120.00 ([id number]). Non covered consent form signed  today.  MELANOCYTIC NEVI Exam: 0.7 cm flesh colored papule of right lip/oral commissure  Treatment Plan: Benign appearing on exam today. Recommend observation.Discussed shave removal. Advised patient not likely covered by insurance due to benign nature. Advised patient fee would be $120.00  ([id number]).   Return in about 1 year (around 09/23/2023).  I, Joanie Coddington, CMA, am acting as scribe for Armida Sans, MD .   Documentation: I have reviewed the above documentation for accuracy and completeness, and I agree with the above.  Armida Sans, MD

## 2022-09-25 ENCOUNTER — Encounter: Payer: Self-pay | Admitting: Dermatology

## 2022-10-01 ENCOUNTER — Telehealth: Payer: Self-pay

## 2022-10-01 NOTE — Telephone Encounter (Signed)
LM on VM please return my call  

## 2022-10-01 NOTE — Telephone Encounter (Signed)
-----   Message from Armida Sans sent at 09/30/2022  6:03 PM EDT ----- Diagnosis Skin , right lip/oral commissure MELANOCYTIC NEVUS, INTRADERMAL TYPE, IRRITATED  Benign irritated mole No further treatment needed

## 2022-10-08 ENCOUNTER — Telehealth: Payer: Self-pay

## 2022-10-08 NOTE — Telephone Encounter (Signed)
-----   Message from Armida Sans sent at 09/30/2022  6:03 PM EDT ----- Diagnosis Skin , right lip/oral commissure MELANOCYTIC NEVUS, INTRADERMAL TYPE, IRRITATED  Benign irritated mole No further treatment needed

## 2022-10-08 NOTE — Telephone Encounter (Signed)
Left voicemail to return my call

## 2022-10-09 NOTE — Telephone Encounter (Signed)
Patient advised of BX results .aw 

## 2022-12-31 ENCOUNTER — Ambulatory Visit
Admission: EM | Admit: 2022-12-31 | Discharge: 2022-12-31 | Disposition: A | Discharge status: Home / Self Care | Payer: Medicare HMO | Attending: Emergency Medicine | Admitting: Emergency Medicine

## 2022-12-31 ENCOUNTER — Encounter: Payer: Self-pay | Admitting: Emergency Medicine

## 2022-12-31 DIAGNOSIS — R112 Nausea with vomiting, unspecified: Secondary | ICD-10-CM | POA: Diagnosis not present

## 2022-12-31 DIAGNOSIS — K900 Celiac disease: Secondary | ICD-10-CM | POA: Diagnosis not present

## 2022-12-31 DIAGNOSIS — Z1152 Encounter for screening for COVID-19: Secondary | ICD-10-CM | POA: Insufficient documentation

## 2022-12-31 DIAGNOSIS — I10 Essential (primary) hypertension: Secondary | ICD-10-CM | POA: Insufficient documentation

## 2022-12-31 LAB — URINALYSIS, W/ REFLEX TO CULTURE (INFECTION SUSPECTED)
Bilirubin Urine: NEGATIVE
Glucose, UA: NEGATIVE mg/dL
Ketones, ur: NEGATIVE mg/dL
Nitrite: NEGATIVE
Specific Gravity, Urine: 1.02 (ref 1.005–1.030)
pH: 5.5 (ref 5.0–8.0)

## 2022-12-31 LAB — CBC WITH DIFFERENTIAL/PLATELET
Abs Immature Granulocytes: 0.04 10*3/uL (ref 0.00–0.07)
Basophils Absolute: 0 10*3/uL (ref 0.0–0.1)
Basophils Relative: 0 %
Eosinophils Absolute: 0.1 10*3/uL (ref 0.0–0.5)
Eosinophils Relative: 1 %
HCT: 33.6 % — ABNORMAL LOW (ref 36.0–46.0)
Hemoglobin: 12 g/dL (ref 12.0–15.0)
Immature Granulocytes: 1 %
Lymphocytes Relative: 9 %
Lymphs Abs: 0.8 10*3/uL (ref 0.7–4.0)
MCH: 30.8 pg (ref 26.0–34.0)
MCHC: 35.7 g/dL (ref 30.0–36.0)
MCV: 86.4 fL (ref 80.0–100.0)
Monocytes Absolute: 0.4 10*3/uL (ref 0.1–1.0)
Monocytes Relative: 5 %
Neutro Abs: 7 10*3/uL (ref 1.7–7.7)
Neutrophils Relative %: 84 %
Platelets: 207 10*3/uL (ref 150–400)
RBC: 3.89 MIL/uL (ref 3.87–5.11)
RDW: 15.4 % (ref 11.5–15.5)
WBC: 8.3 10*3/uL (ref 4.0–10.5)
nRBC: 0 % (ref 0.0–0.2)

## 2022-12-31 LAB — COMPREHENSIVE METABOLIC PANEL
ALT: 20 U/L (ref 0–44)
AST: 23 U/L (ref 15–41)
Albumin: 4.9 g/dL (ref 3.5–5.0)
Alkaline Phosphatase: 71 U/L (ref 38–126)
Anion gap: 10 (ref 5–15)
BUN: 23 mg/dL (ref 8–23)
CO2: 24 mmol/L (ref 22–32)
Calcium: 9.9 mg/dL (ref 8.9–10.3)
Chloride: 102 mmol/L (ref 98–111)
Creatinine, Ser: 1.1 mg/dL — ABNORMAL HIGH (ref 0.44–1.00)
GFR, Estimated: 51 mL/min — ABNORMAL LOW (ref >60)
Glucose, Bld: 115 mg/dL — ABNORMAL HIGH (ref 70–99)
Potassium: 3.8 mmol/L (ref 3.5–5.1)
Sodium: 136 mmol/L (ref 135–145)
Total Bilirubin: 1.1 mg/dL (ref <1.2)
Total Protein: 7.9 g/dL (ref 6.5–8.1)

## 2022-12-31 LAB — RESP PANEL BY RT-PCR (FLU A&B, COVID) ARPGX2
Influenza A by PCR: NEGATIVE
Influenza B by PCR: NEGATIVE
SARS Coronavirus 2 by RT PCR: NEGATIVE

## 2022-12-31 MED ORDER — ONDANSETRON 4 MG PO TBDP
4.0000 mg | ORAL_TABLET | Freq: Once | ORAL | Status: AC
  Administered 2022-12-31: 4 mg via ORAL

## 2022-12-31 MED ORDER — ONDANSETRON 8 MG PO TBDP: 8.0000 mg | ORAL_TABLET | Freq: Three times a day (TID) | ORAL | 0 refills | Status: AC | PRN

## 2022-12-31 NOTE — Discharge Instructions (Addendum)
Your evaluation here in urgent care was unremarkable.  I do believe that you have a viral GI illness which is causing your nausea and vomiting.  There is no evidence of dehydration or electrolyte abnormalities.  Use the Zofran every 8 hours as needed for nausea and vomiting.  Continue to orally rehydrate using water, ginger ale, Gatorade, Powerade, or oral electrolyte solution such as liquid IV.  If you develop any new or worsening symptoms please return for reevaluation.  If you have a continuation of nausea and vomiting and cannot keep down fluids despite the Zofran or you develop dizziness you need to go to the ER for evaluation.

## 2022-12-31 NOTE — ED Triage Notes (Signed)
Pt presents with elevated BP for the past 3 days. She reports that her Systolic was in the 180's, but doesn't remember her Diastolic.  She has vomiting that started yesterday. She reports 3 cases yesterday and today.

## 2022-12-31 NOTE — ED Provider Notes (Signed)
[facility]    CSN: [id number] Arrival date & time: 12/31/22  1137      History   Chief Complaint Chief Complaint  Patient presents with   Emesis   Hypertension    HPI Frances Phillips is a 82 y.o. female.   HPI  82 year old female with a past medical history significant for thyroid disease, hypertension, hearing loss, celiac disease, and basal cell carcinoma presents for evaluation of elevated blood pressure yesterday with nausea and vomiting that started yesterday.  She had 3 episodes of vomiting yesterday and 3 episodes today but no blood in her vomit.  She denies any abdominal pain or diarrhea.  She also denies any runny nose, nasal congestion, or cough.  She has not been or anyone else who has been sick.  Her blood pressure was elevated yesterday with a systolic in the 180s and she cannot remember the diastolic.  She reports that she has not missed any doses of her antihypertensives.  She denies headache, chest pain, or dizziness.  No changes to her diet or increase in salt intake.  Currently her blood pressure is 158/64.  Past Medical History:  Diagnosis Date   Basal cell carcinoma 07/24/2015   Left medial canthus. Nodular pattern. Excised 08/15/2015, margins free.   Celiac disease    Hearing loss    Hypertension    Thyroid disease     There are no problems to display for this patient.   Past Surgical History:  Procedure Laterality Date   ABDOMINAL HYSTERECTOMY      OB History   No obstetric history on file.      Home Medications    Prior to Admission medications   Medication Sig Start Date End Date Taking? Authorizing Provider  amLODipine-valsartan (EXFORGE) 10-320 MG tablet Take 1 tablet by mouth daily. 11/04/22  Yes [provider]  budesonide (ENTOCORT EC) 3 MG 24 hr capsule Take 9 mg by mouth every morning. 12/27/20  Yes [provider]  cyanocobalamin 1000 MCG tablet Take by mouth.   Yes [provider]  diclofenac  Sodium (VOLTAREN) 1 % GEL diclofenac 1 % topical gel  APPLY TWO GRAMS TO THE AFFECTED AREA(S) FOUR TIMES DAILY   Yes [provider]  EXELON 9.5 MG/24HR 9.5 mg daily. 07/24/22  Yes [provider]  gabapentin (NEURONTIN) 100 MG capsule gabapentin 100 mg capsule   Yes [provider]  levothyroxine (SYNTHROID, LEVOTHROID) 100 MCG tablet Take 100 mcg by mouth daily before breakfast.   Yes [provider]  memantine (NAMENDA) 5 MG tablet Take 5 mg by mouth 2 (two) times daily. 07/24/22  Yes [provider]  omeprazole (PRILOSEC) 20 MG capsule omeprazole 20 mg capsule,delayed release   Yes [provider]  ondansetron (ZOFRAN-ODT) 8 MG disintegrating tablet Take 1 tablet (8 mg total) by mouth every 8 (eight) hours as needed for nausea or vomiting. 12/31/22  Yes Becky Augusta, NP  phenazopyridine (PYRIDIUM) 200 MG tablet Take 1 tablet (200 mg total) by mouth 3 (three) times daily. 09/30/21  Yes Becky Augusta, NP  pravastatin (PRAVACHOL) 40 MG tablet  05/23/16  Yes [provider]  venlafaxine (EFFEXOR) 37.5 MG tablet Take 37.5 mg by mouth 2 (two) times daily.   Yes [provider]  albuterol (VENTOLIN HFA) 108 (90 Base) MCG/ACT inhaler Inhale 1-2 puffs into the lungs every 4 (four) hours as needed for wheezing or shortness of breath. 01/31/19   Verlee Monte, NP  allopurinol Tristan Schroeder)  300 MG tablet Take 300 mg by mouth daily.    [provider]  benzonatate (TESSALON) 100 MG capsule Take 2 capsules (200 mg total) by mouth every 8 (eight) hours. 03/03/21   Becky Augusta, NP  ipratropium (ATROVENT) 0.06 % nasal spray Place 2 sprays into both nostrils 4 (four) times daily. 03/03/21   Becky Augusta, NP  lisinopril (PRINIVIL,ZESTRIL) 40 MG tablet  05/23/16   [provider]  nitrofurantoin, macrocrystal-monohydrate, (MACROBID) 100 MG capsule Take 1 capsule (100 mg total) by mouth 2 (two) times daily. 08/07/22   [name], DO   oseltamivir (TAMIFLU) 75 MG capsule Take 1 capsule (75 mg total) by mouth daily. 02/20/22   White, [name], NP  predniSONE (DELTASONE) 20 MG tablet Take 2 tablets (40 mg total) by mouth daily. 02/20/22   [name], NP    Family History History reviewed. No pertinent family history.  Social History Social History   Tobacco Use   Smoking status: Never   Smokeless tobacco: Never  Vaping Use   Vaping status: Never Used  Substance Use Topics   Alcohol use: No   Drug use: No     Allergies   Gluten meal and Percocet [oxycodone-acetaminophen]   Review of Systems Review of Systems  Constitutional:  Negative for fever.  HENT:  Negative for congestion, rhinorrhea and sore throat.   Respiratory:  Negative for cough, shortness of breath and wheezing.   Gastrointestinal:  Positive for nausea and vomiting. Negative for abdominal pain, blood in stool and diarrhea.  Genitourinary:  Negative for dysuria, frequency, hematuria and urgency.  Skin:  Negative for rash.     Physical Exam Triage Vital Signs ED Triage Vitals  Encounter Vitals Group     BP      Systolic BP Percentile      Diastolic BP Percentile      Pulse      Resp      Temp      Temp src      SpO2      Weight      Height      Head Circumference      Peak Flow      Pain Score      Pain Loc      Pain Education      Exclude from Growth Chart    No data found.  Updated Vital Signs BP (!) 158/64 (BP Location: Left Arm)   Pulse 60   Temp 98 F (36.7 C) (Oral)   Resp 16   SpO2 100%   Visual Acuity Right Eye Distance:   Left Eye Distance:   Bilateral Distance:    Right Eye Near:   Left Eye Near:    Bilateral Near:     Physical Exam Vitals and nursing note reviewed.  Constitutional:      Appearance: Normal appearance. She is not ill-appearing.  HENT:     Head: Normocephalic and atraumatic.  Cardiovascular:     Rate and Rhythm: Normal rate and regular rhythm.     Pulses: Normal pulses.      Heart sounds: Normal heart sounds. No murmur heard.    No friction rub. No gallop.  Pulmonary:     Effort: Pulmonary effort is normal.     Breath sounds: Normal breath sounds. No wheezing, rhonchi or rales.  Abdominal:     General: Abdomen is flat.     Palpations: Abdomen is soft.     Tenderness: There is  no abdominal tenderness. There is no guarding or rebound.  Skin:    General: Skin is warm and dry.     Capillary Refill: Capillary refill takes less than 2 seconds.     Findings: No rash.  Neurological:     General: No focal deficit present.     Mental Status: She is alert and oriented to person, place, and time.      UC Treatments / Results  Labs (all labs ordered are listed, but only abnormal results are displayed) Labs Reviewed  CBC WITH DIFFERENTIAL/PLATELET - Abnormal; Notable for the following components:      Result Value   HCT 33.6 (*)    All other components within normal limits  COMPREHENSIVE METABOLIC PANEL - Abnormal; Notable for the following components:   Glucose, Bld 115 (*)    Creatinine, Ser 1.10 (*)    GFR, Estimated 51 (*)    All other components within normal limits  URINALYSIS, W/ REFLEX TO CULTURE (INFECTION SUSPECTED) - Abnormal; Notable for the following components:   APPearance HAZY (*)    Hgb urine dipstick SMALL (*)    Protein, ur TRACE (*)    Leukocytes,Ua SMALL (*)    Bacteria, UA FEW (*)    All other components within normal limits  RESP PANEL BY RT-PCR (FLU A&B, COVID) [id number]    EKG   Radiology No results found.  Procedures Procedures (including critical care time)  Medications Ordered in UC Medications  ondansetron (ZOFRAN-ODT) disintegrating tablet 4 mg (4 mg Oral Given 12/31/22 1256)    Initial Impression / Assessment and Plan / UC Course  I have reviewed the triage vital signs and the nursing notes.  Pertinent labs & imaging results that were available during my care of the patient were reviewed by me and considered in my  medical decision making (see chart for details).   Patient is a pleasant, nontoxic-appearing 82 year old female here for evaluation of nausea and vomiting and also elevated blood pressure yesterday with a systolic in the 180s.  Patient cannot remember her diastolic.  Currently her blood pressure is 158/64 which does show mild hypertension but is not of great concern.  She is reports that she has not missed any of her doses of blood pressure medication.  She has no associated respiratory symptoms or GI symptoms other than the nausea and vomiting.  Her abdomen is soft, flat, and nontender.  Of concern is that she reports she has not been able to keep down oral fluids.  I will check CBC and CMP to evaluate for any signs of systemic infection or electrolyte imbalance, urinalysis to evaluate hydration, and COVID and flu PCR to evaluate for atypical causes of nausea and vomiting.  Electrolyte abnormalities are also concerning given that she is on amlodipine valsartan and I want to ensure that her renal function is not impacted.  I will order 4 mg p.o. Zofran followed by p.o. challenge 20 minutes after administration.  CBC shows normal white count of 8.3, H&H of 12 and 33.6 respectively, platelets 207.  No abnormalities to the differential.  CMP shows an elevated glucose of 115 and mildly elevated creatinine of 1.10 but BUN, electrolytes, transaminases are unremarkable.  CMP from [facility] from 2022 shows an elevated creatinine of 1.4.  Urinalysis shows hazy appearance with small hemoglobin, trace protein, small leukocyte esterase but is negative for nitrites or ketones.  Reflex microscopy shows 6-10 squamous with 11-20 WBCs, 6-10 RBCs, few bacteria, and WBC clumps present.  Following Zofran ministration patient is able to tolerate p.o. fluids and has passed her p.o. challenge.  Respiratory panel is negative for COVID or influenza.  I will discharge patient home with a diagnosis of nausea and  vomiting and prescribe her Zofran that she can use every 8 hours as needed for nausea and vomiting.  She should continue to orally rehydrate.  If she has an increase in vomiting and is unable to keep down fluids despite the Zofran that she needs to go to the ER for evaluation.   Final Clinical Impressions(s) / UC Diagnoses   Final diagnoses:  Nausea and vomiting, unspecified vomiting type     Discharge Instructions      Your evaluation here in urgent care was unremarkable.  I do believe that you have a viral GI illness which is causing your nausea and vomiting.  There is no evidence of dehydration or electrolyte abnormalities.  Use the Zofran every 8 hours as needed for nausea and vomiting.  Continue to orally rehydrate using water, ginger ale, Gatorade, Powerade, or oral electrolyte solution such as liquid IV.  If you develop any new or worsening symptoms please return for reevaluation.  If you have a continuation of nausea and vomiting and cannot keep down fluids despite the Zofran or you develop dizziness you need to go to the ER for evaluation.     ED Prescriptions     Medication Sig Dispense Auth. Provider   ondansetron (ZOFRAN-ODT) 8 MG disintegrating tablet Take 1 tablet (8 mg total) by mouth every 8 (eight) hours as needed for nausea or vomiting. 20 tablet Becky Augusta, NP      PDMP not reviewed this encounter.   Becky Augusta, NP 12/31/22 1346

## 2023-05-05 ENCOUNTER — Ambulatory Visit
Admission: RE | Admit: 2023-05-05 | Discharge: 2023-05-05 | Disposition: A | Discharge status: Home / Self Care | Payer: Medicare HMO | Source: Ambulatory Visit | Attending: Family Medicine | Admitting: Family Medicine

## 2023-05-05 DIAGNOSIS — M8580 Other specified disorders of bone density and structure, unspecified site: Secondary | ICD-10-CM

## 2023-07-06 IMAGING — MG MM DIGITAL SCREENING BILAT W/ TOMO AND CAD
8 series · 8 of 24 positions shown · non-contrast
Comparison: Previous exam(s).

CLINICAL DATA: Screening.

EXAM:
DIGITAL SCREENING BILATERAL MAMMOGRAM WITH TOMOSYNTHESIS AND CAD
TECHNIQUE: Bilateral screening digital craniocaudal and mediolateral oblique
mammograms were obtained. Bilateral screening digital breast
tomosynthesis was performed. The images were evaluated with
computer-aided detection.

[L CC synth-2D]
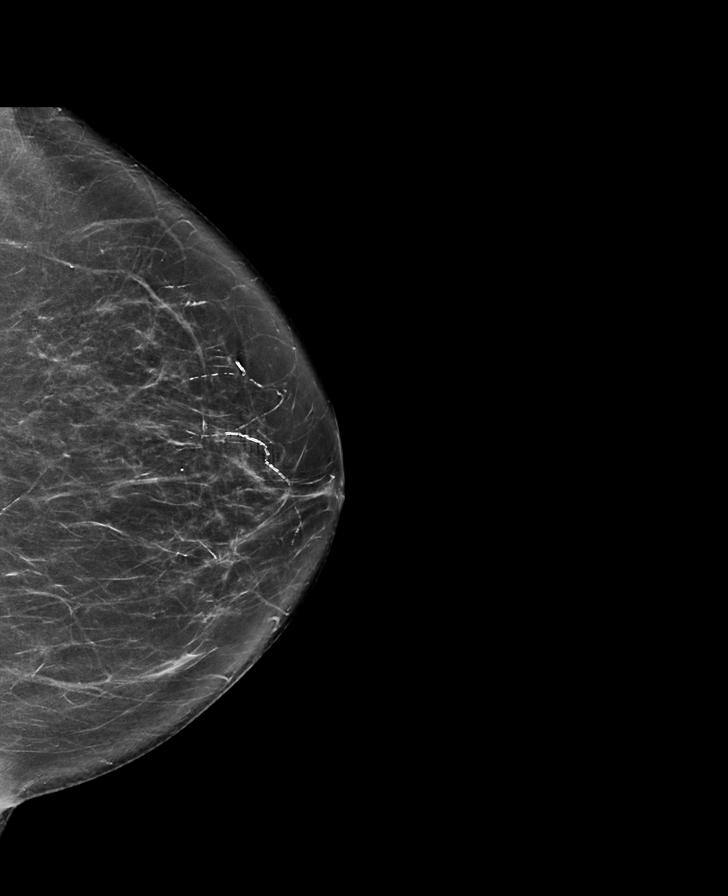

[L MLO synth-2D]
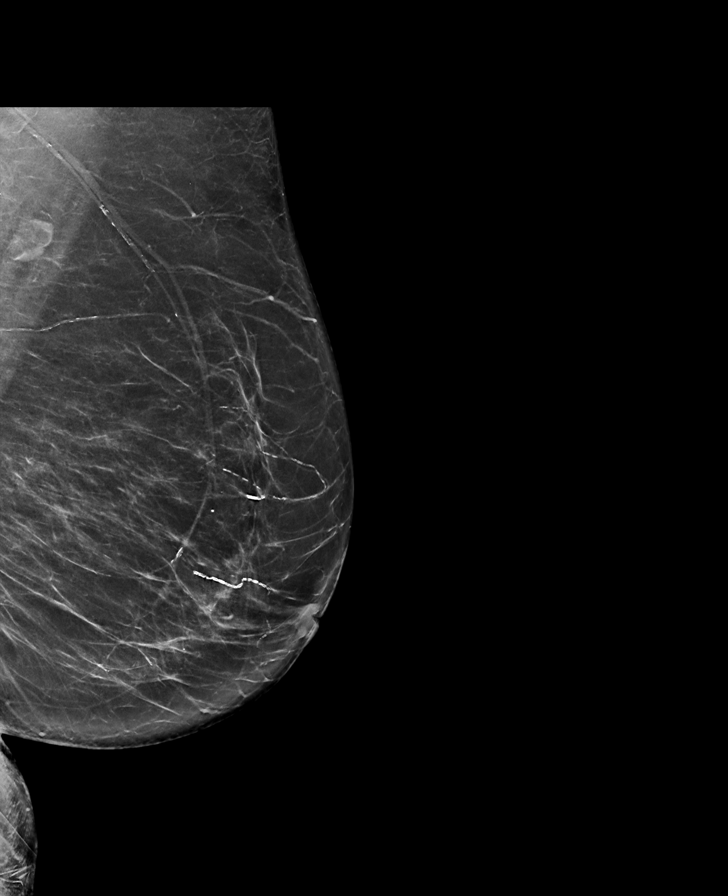

[R MLO synth-2D]
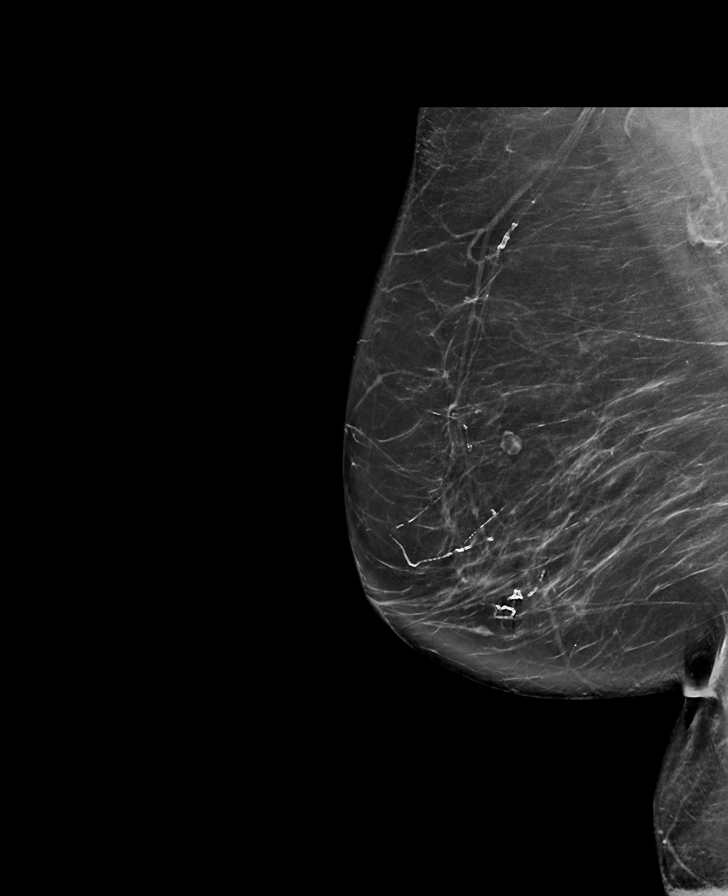

[R CC synth-2D]
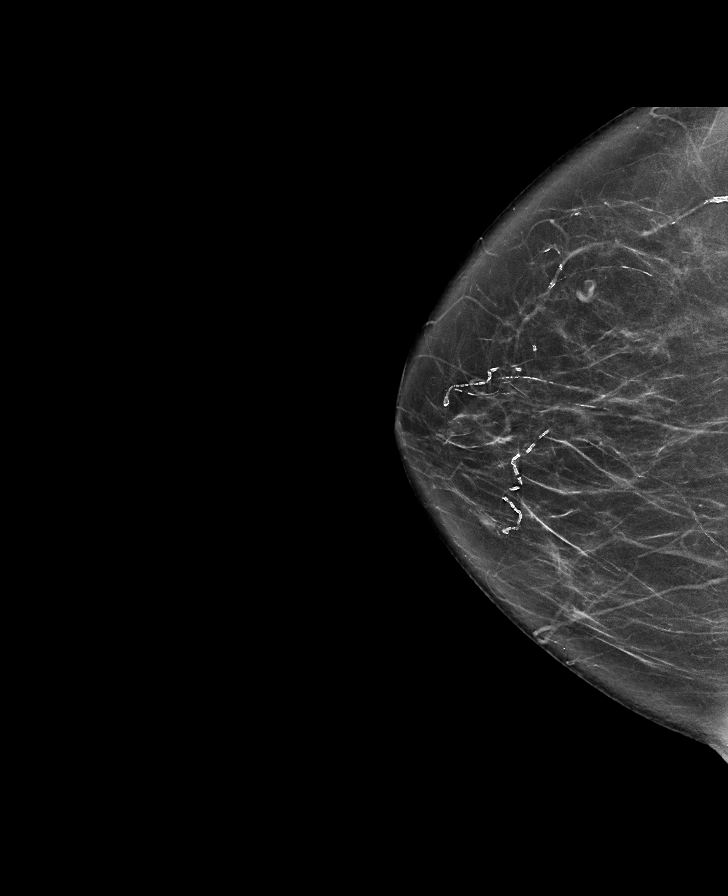

[L MLO tomo · tomo slice 40/79.0]
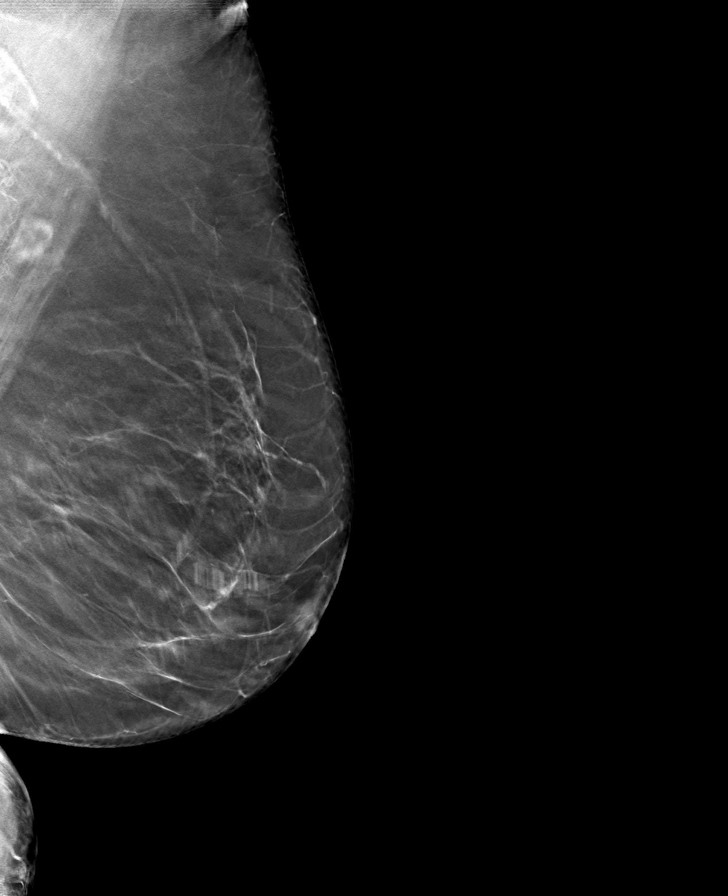

[L CC tomo · tomo slice 39/77.0]
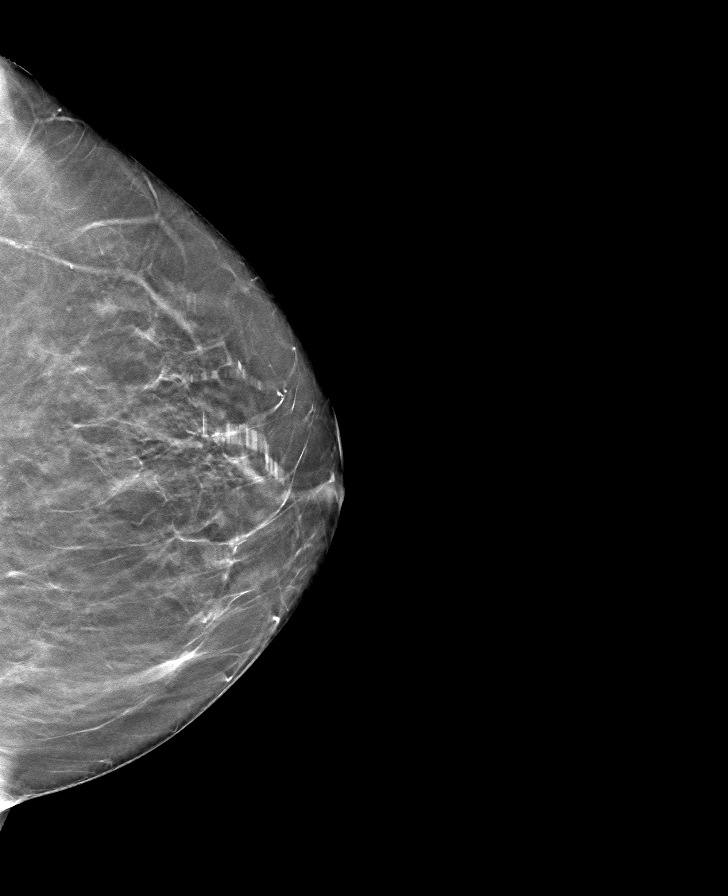

[R CC tomo · tomo slice 39/77.0]
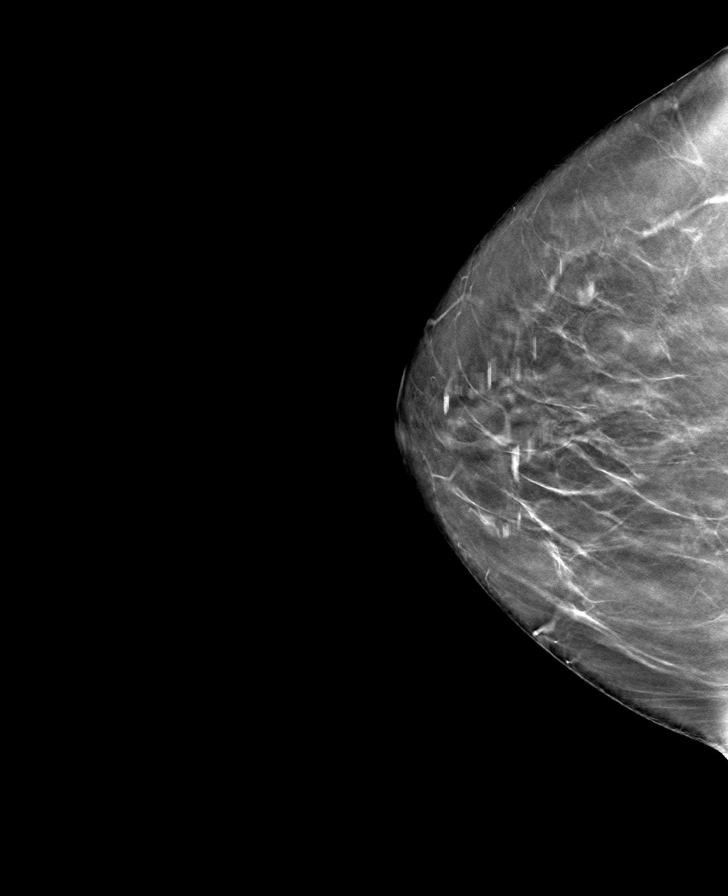

[R MLO tomo · tomo slice 42/83.0]
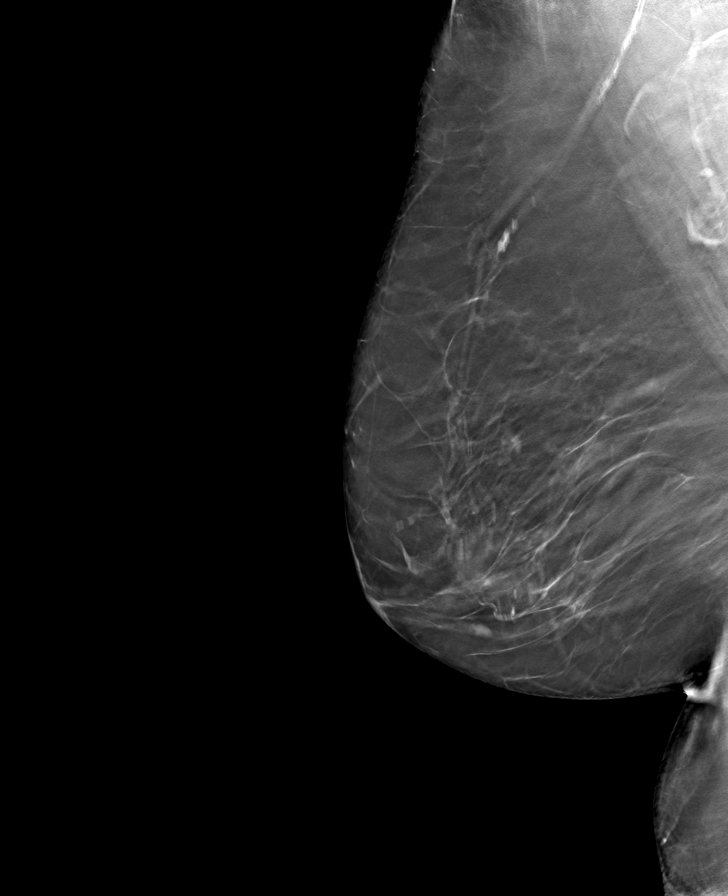

[8 of 24 positions shown; findings below may reference images not displayed]

ACR Breast Density Category b: There are scattered areas of
fibroglandular density.
FINDINGS: There are no findings suspicious for malignancy.
IMPRESSION: No mammographic evidence of malignancy. A result letter of this
screening mammogram will be mailed directly to the patient.

RECOMMENDATION:
Screening mammogram in one year. (Code:51-O-LD2)

BI-RADS CATEGORY  1: Negative.

## 2023-10-15 ENCOUNTER — Ambulatory Visit: Payer: Medicare HMO | Admitting: Dermatology

## 2023-10-27 ENCOUNTER — Encounter: Payer: Self-pay | Admitting: Dermatology

## 2023-10-27 ENCOUNTER — Ambulatory Visit: Admitting: Dermatology

## 2023-10-27 DIAGNOSIS — L820 Inflamed seborrheic keratosis: Secondary | ICD-10-CM

## 2023-10-27 DIAGNOSIS — L821 Other seborrheic keratosis: Secondary | ICD-10-CM

## 2023-10-27 DIAGNOSIS — Z85828 Personal history of other malignant neoplasm of skin: Secondary | ICD-10-CM | POA: Diagnosis not present

## 2023-10-27 DIAGNOSIS — L578 Other skin changes due to chronic exposure to nonionizing radiation: Secondary | ICD-10-CM | POA: Diagnosis not present

## 2023-10-27 DIAGNOSIS — W908XXA Exposure to other nonionizing radiation, initial encounter: Secondary | ICD-10-CM

## 2023-10-27 DIAGNOSIS — L570 Actinic keratosis: Secondary | ICD-10-CM | POA: Diagnosis not present

## 2023-10-27 NOTE — Progress Notes (Deleted)
   Follow-Up Visit   Subjective  Frances Phillips is a 83 y.o. female who presents for the following: Skin Cancer Screening and Full Body Skin Exam  The patient presents for Total-Body Skin Exam (TBSE) for skin cancer screening and mole check. The patient has spots, moles and lesions to be evaluated, some may be new or changing and the patient may have concern these could be cancer.    The following portions of the chart were reviewed this encounter and updated as appropriate: medications, allergies, medical history  Review of Systems:  No other skin or systemic complaints except as noted in HPI or Assessment and Plan.  Objective  Well appearing patient in no apparent distress; mood and affect are within normal limits.  A full examination was performed including scalp, head, eyes, ears, nose, lips, neck, chest, axillae, abdomen, back, buttocks, bilateral upper extremities, bilateral lower extremities, hands, feet, fingers, toes, fingernails, and toenails. All findings within normal limits unless otherwise noted below.   Relevant physical exam findings are noted in the Assessment and Plan.    Assessment & Plan   SKIN CANCER SCREENING PERFORMED TODAY.  ACTINIC DAMAGE - Chronic condition, secondary to cumulative UV/sun exposure - diffuse scaly erythematous macules with underlying dyspigmentation - Recommend daily broad spectrum sunscreen SPF 30+ to sun-exposed areas, reapply every 2 hours as needed.  - Staying in the shade or wearing long sleeves, sun glasses (UVA+UVB protection) and wide brim hats (4-inch brim around the entire circumference of the hat) are also recommended for sun protection.  - Call for new or changing lesions.  LENTIGINES, SEBORRHEIC KERATOSES, HEMANGIOMAS - Benign normal skin lesions - Benign-appearing - Call for any changes  MELANOCYTIC NEVI - Tan-brown and/or pink-flesh-colored symmetric macules and papules - Benign appearing on exam today -  Observation - Call clinic for new or changing moles - Recommend daily use of broad spectrum spf 30+ sunscreen to sun-exposed areas.   HISTORY OF BASAL CELL CARCINOMA OF THE SKIN - L med canthus, excised 08/15/2015 - No evidence of recurrence today - Recommend regular full body skin exams - Recommend daily broad spectrum sunscreen SPF 30+ to sun-exposed areas, reapply every 2 hours as needed.  - Call if any new or changing lesions are noted between office visits  No follow-ups on file.  Frances Phillips, CMA, am acting as scribe for Alm Rhyme, MD .   Documentation: I have reviewed the above documentation for accuracy and completeness, and I agree with the above.  Alm Rhyme, MD

## 2023-10-27 NOTE — Progress Notes (Signed)
 Follow-Up Visit   Subjective  Frances Phillips is a 83 y.o. female who presents for the following: Recheck of previous BCC site on the L medial canthus. Patient declines FBSE with skin cancer screening today and has no other spots on the skin she is concerned about.  The following portions of the chart were reviewed this encounter and updated as appropriate: medications, allergies, medical history  Review of Systems:  No other skin or systemic complaints except as noted in HPI or Assessment and Plan.  Objective  Well appearing patient in no apparent distress; mood and affect are within normal limits.  A focused examination was performed of the following areas: the face, arms, and hands  Relevant exam findings are noted in the Assessment and Plan.  R infra orbital x 1, R inf cheek x 1, R upper lip x 1, L upper lip x 1 (4) Erythematous thin papules/macules with gritty scale.  L hand x 3 (3) Erythematous stuck-on, waxy papule or plaque  Assessment & Plan   ACTINIC DAMAGE - chronic, secondary to cumulative UV radiation exposure/sun exposure over time - diffuse scaly erythematous macules with underlying dyspigmentation - Recommend daily broad spectrum sunscreen SPF 30+ to sun-exposed areas, reapply every 2 hours as needed.  - Recommend staying in the shade or wearing long sleeves, sun glasses (UVA+UVB protection) and wide brim hats (4-inch brim around the entire circumference of the hat). - Call for new or changing lesions.  HISTORY OF BASAL CELL CARCINOMA OF THE SKIN Patient declines skin cancer screening today with full-body skin exam. - No evidence of recurrence today - Recommend regular full body skin exams - Recommend daily broad spectrum sunscreen SPF 30+ to sun-exposed areas, reapply every 2 hours as needed.  - Call if any new or changing lesions are noted between office visits  AK (ACTINIC KERATOSIS) (4) R infra orbital x 1, R inf cheek x 1, R upper lip x 1, L upper lip x  1 (4) Actinic keratoses are precancerous spots that appear secondary to cumulative UV radiation exposure/sun exposure over time. They are chronic with expected duration over 1 year. A portion of actinic keratoses will progress to squamous cell carcinoma of the skin. It is not possible to reliably predict which spots will progress to skin cancer and so treatment is recommended to prevent development of skin cancer.  Recommend daily broad spectrum sunscreen SPF 30+ to sun-exposed areas, reapply every 2 hours as needed.  Recommend staying in the shade or wearing long sleeves, sun glasses (UVA+UVB protection) and wide brim hats (4-inch brim around the entire circumference of the hat). Call for new or changing lesions.  RTC if lesions treated today not resolved after two months.   Destruction of lesion - R infra orbital x 1, R inf cheek x 1, R upper lip x 1, L upper lip x 1 (4) Complexity: simple   Destruction method: cryotherapy   Informed consent: discussed and consent obtained   Timeout:  patient name, date of birth, surgical site, and procedure verified Lesion destroyed using liquid nitrogen: Yes   Region frozen until ice ball extended beyond lesion: Yes   Outcome: patient tolerated procedure well with no complications   Post-procedure details: wound care instructions given    INFLAMED SEBORRHEIC KERATOSIS (3) L hand x 3 (3) Symptomatic, irritating, patient would like treated.  Destruction of lesion - L hand x 3 (3) Complexity: simple   Destruction method: cryotherapy   Informed consent: discussed and consent obtained  Timeout:  patient name, date of birth, surgical site, and procedure verified Lesion destroyed using liquid nitrogen: Yes   Region frozen until ice ball extended beyond lesion: Yes   Outcome: patient tolerated procedure well with no complications   Post-procedure details: wound care instructions given     SEBORRHEIC KERATOSIS - Stuck-on, waxy, tan-brown papules and/or  plaques  - Benign-appearing - Discussed benign etiology and prognosis. - Observe - Call for any changes  Return in about 1 year (around 10/26/2024) for TBSE - hx BCC.  Frances Phillips, CMA, am acting as scribe for Alm Rhyme, MD .  Documentation: I have reviewed the above documentation for accuracy and completeness, and I agree with the above.  Alm Rhyme, MD

## 2023-10-27 NOTE — Patient Instructions (Signed)

## 2023-11-28 ENCOUNTER — Encounter (HOSPITAL_COMMUNITY): Payer: Self-pay

## 2023-11-28 ENCOUNTER — Emergency Department

## 2023-11-28 ENCOUNTER — Inpatient Hospital Stay (HOSPITAL_COMMUNITY)
Admission: EM | Admit: 2023-11-28 | Discharge: 2023-12-04 | DRG: 101 | Disposition: A | Discharge status: Home Health | Attending: Internal Medicine | Admitting: Internal Medicine

## 2023-11-28 ENCOUNTER — Other Ambulatory Visit: Payer: Self-pay

## 2023-11-28 ENCOUNTER — Emergency Department
Admission: EM | Admit: 2023-11-28 | Discharge: 2023-11-28 | Disposition: A | Discharge status: Other Acute Inpatient Hospital

## 2023-11-28 DIAGNOSIS — G40101 Localization-related (focal) (partial) symptomatic epilepsy and epileptic syndromes with simple partial seizures, not intractable, with status epilepticus: Principal | ICD-10-CM | POA: Diagnosis present

## 2023-11-28 DIAGNOSIS — R29730 NIHSS score 30: Secondary | ICD-10-CM | POA: Diagnosis not present

## 2023-11-28 DIAGNOSIS — R569 Unspecified convulsions: Secondary | ICD-10-CM

## 2023-11-28 DIAGNOSIS — Z7401 Bed confinement status: Secondary | ICD-10-CM | POA: Diagnosis not present

## 2023-11-28 DIAGNOSIS — I639 Cerebral infarction, unspecified: Secondary | ICD-10-CM

## 2023-11-28 DIAGNOSIS — E785 Hyperlipidemia, unspecified: Secondary | ICD-10-CM | POA: Diagnosis present

## 2023-11-28 DIAGNOSIS — D649 Anemia, unspecified: Secondary | ICD-10-CM | POA: Insufficient documentation

## 2023-11-28 DIAGNOSIS — Z79890 Hormone replacement therapy (postmenopausal): Secondary | ICD-10-CM

## 2023-11-28 DIAGNOSIS — H9190 Unspecified hearing loss, unspecified ear: Secondary | ICD-10-CM | POA: Diagnosis present

## 2023-11-28 DIAGNOSIS — R4781 Slurred speech: Secondary | ICD-10-CM | POA: Insufficient documentation

## 2023-11-28 DIAGNOSIS — F03B3 Unspecified dementia, moderate, with mood disturbance: Secondary | ICD-10-CM | POA: Diagnosis present

## 2023-11-28 DIAGNOSIS — I129 Hypertensive chronic kidney disease with stage 1 through stage 4 chronic kidney disease, or unspecified chronic kidney disease: Secondary | ICD-10-CM | POA: Diagnosis present

## 2023-11-28 DIAGNOSIS — Z66 Do not resuscitate: Secondary | ICD-10-CM | POA: Diagnosis present

## 2023-11-28 DIAGNOSIS — G40901 Epilepsy, unspecified, not intractable, with status epilepticus: Principal | ICD-10-CM

## 2023-11-28 DIAGNOSIS — K219 Gastro-esophageal reflux disease without esophagitis: Secondary | ICD-10-CM | POA: Diagnosis present

## 2023-11-28 DIAGNOSIS — N1831 Chronic kidney disease, stage 3a: Secondary | ICD-10-CM | POA: Diagnosis present

## 2023-11-28 DIAGNOSIS — N183 Chronic kidney disease, stage 3 unspecified: Secondary | ICD-10-CM | POA: Diagnosis present

## 2023-11-28 DIAGNOSIS — R1312 Dysphagia, oropharyngeal phase: Secondary | ICD-10-CM | POA: Diagnosis present

## 2023-11-28 DIAGNOSIS — R404 Transient alteration of awareness: Secondary | ICD-10-CM | POA: Diagnosis not present

## 2023-11-28 DIAGNOSIS — F0390 Unspecified dementia without behavioral disturbance: Secondary | ICD-10-CM | POA: Diagnosis present

## 2023-11-28 DIAGNOSIS — Z781 Physical restraint status: Secondary | ICD-10-CM

## 2023-11-28 DIAGNOSIS — D72829 Elevated white blood cell count, unspecified: Secondary | ICD-10-CM | POA: Diagnosis present

## 2023-11-28 DIAGNOSIS — Z91018 Allergy to other foods: Secondary | ICD-10-CM

## 2023-11-28 DIAGNOSIS — Z9181 History of falling: Secondary | ICD-10-CM

## 2023-11-28 DIAGNOSIS — F05 Delirium due to known physiological condition: Secondary | ICD-10-CM | POA: Diagnosis not present

## 2023-11-28 DIAGNOSIS — E039 Hypothyroidism, unspecified: Secondary | ICD-10-CM | POA: Diagnosis present

## 2023-11-28 DIAGNOSIS — K900 Celiac disease: Secondary | ICD-10-CM | POA: Diagnosis present

## 2023-11-28 DIAGNOSIS — I10 Essential (primary) hypertension: Secondary | ICD-10-CM | POA: Diagnosis not present

## 2023-11-28 DIAGNOSIS — Z90710 Acquired absence of both cervix and uterus: Secondary | ICD-10-CM

## 2023-11-28 DIAGNOSIS — E872 Acidosis, unspecified: Secondary | ICD-10-CM | POA: Diagnosis present

## 2023-11-28 DIAGNOSIS — N189 Chronic kidney disease, unspecified: Secondary | ICD-10-CM | POA: Insufficient documentation

## 2023-11-28 DIAGNOSIS — D631 Anemia in chronic kidney disease: Secondary | ICD-10-CM | POA: Diagnosis present

## 2023-11-28 DIAGNOSIS — G629 Polyneuropathy, unspecified: Secondary | ICD-10-CM | POA: Diagnosis present

## 2023-11-28 DIAGNOSIS — Z885 Allergy status to narcotic agent status: Secondary | ICD-10-CM

## 2023-11-28 DIAGNOSIS — Z79899 Other long term (current) drug therapy: Secondary | ICD-10-CM

## 2023-11-28 DIAGNOSIS — F03B2 Unspecified dementia, moderate, with psychotic disturbance: Secondary | ICD-10-CM | POA: Diagnosis present

## 2023-11-28 DIAGNOSIS — Z85828 Personal history of other malignant neoplasm of skin: Secondary | ICD-10-CM

## 2023-11-28 LAB — MAGNESIUM: Magnesium: 1.7 mg/dL (ref 1.7–2.4)

## 2023-11-28 LAB — URINE DRUG SCREEN, QUALITATIVE (ARMC ONLY)
Amphetamines, Ur Screen: NOT DETECTED
Barbiturates, Ur Screen: NOT DETECTED
Benzodiazepine, Ur Scrn: POSITIVE — AB
Cannabinoid 50 Ng, Ur ~~LOC~~: NOT DETECTED
Cocaine Metabolite,Ur ~~LOC~~: NOT DETECTED
MDMA (Ecstasy)Ur Screen: NOT DETECTED
Methadone Scn, Ur: NOT DETECTED
Opiate, Ur Screen: NOT DETECTED
Phencyclidine (PCP) Ur S: NOT DETECTED
Tricyclic, Ur Screen: NOT DETECTED

## 2023-11-28 LAB — APTT: aPTT: 29 s (ref 24–36)

## 2023-11-28 LAB — COMPREHENSIVE METABOLIC PANEL WITH GFR
ALT: 23 U/L (ref 0–44)
AST: 42 U/L — ABNORMAL HIGH (ref 15–41)
Albumin: 4.4 g/dL (ref 3.5–5.0)
Alkaline Phosphatase: 71 U/L (ref 38–126)
Anion gap: 15 (ref 5–15)
BUN: 22 mg/dL (ref 8–23)
CO2: 17 mmol/L — ABNORMAL LOW (ref 22–32)
Calcium: 8.8 mg/dL — ABNORMAL LOW (ref 8.9–10.3)
Chloride: 104 mmol/L (ref 98–111)
Creatinine, Ser: 1.22 mg/dL — ABNORMAL HIGH (ref 0.44–1.00)
GFR, Estimated: 45 mL/min — ABNORMAL LOW (ref >60)
Glucose, Bld: 125 mg/dL — ABNORMAL HIGH (ref 70–99)
Potassium: 3.9 mmol/L (ref 3.5–5.1)
Sodium: 136 mmol/L (ref 135–145)
Total Bilirubin: 0.8 mg/dL (ref 0.0–1.2)
Total Protein: 7.1 g/dL (ref 6.5–8.1)

## 2023-11-28 LAB — DIFFERENTIAL
Abs Immature Granulocytes: 0.08 K/uL — ABNORMAL HIGH (ref 0.00–0.07)
Basophils Absolute: 0 K/uL (ref 0.0–0.1)
Basophils Relative: 0 %
Eosinophils Absolute: 0.2 K/uL (ref 0.0–0.5)
Eosinophils Relative: 2 %
Immature Granulocytes: 1 %
Lymphocytes Relative: 18 %
Lymphs Abs: 1.8 K/uL (ref 0.7–4.0)
Monocytes Absolute: 0.3 K/uL (ref 0.1–1.0)
Monocytes Relative: 3 %
Neutro Abs: 7.3 K/uL (ref 1.7–7.7)
Neutrophils Relative %: 76 %

## 2023-11-28 LAB — CBG MONITORING, ED: Glucose-Capillary: 111 mg/dL — ABNORMAL HIGH (ref 70–99)

## 2023-11-28 LAB — CBC
HCT: 30.8 % — ABNORMAL LOW (ref 36.0–46.0)
Hemoglobin: 10.3 g/dL — ABNORMAL LOW (ref 12.0–15.0)
MCH: 30.7 pg (ref 26.0–34.0)
MCHC: 33.4 g/dL (ref 30.0–36.0)
MCV: 91.7 fL (ref 80.0–100.0)
Platelets: 218 K/uL (ref 150–400)
RBC: 3.36 MIL/uL — ABNORMAL LOW (ref 3.87–5.11)
RDW: 15.9 % — ABNORMAL HIGH (ref 11.5–15.5)
WBC: 9.7 K/uL (ref 4.0–10.5)
nRBC: 0 % (ref 0.0–0.2)

## 2023-11-28 LAB — PROTIME-INR
INR: 1 (ref 0.8–1.2)
Prothrombin Time: 13.7 s (ref 11.4–15.2)

## 2023-11-28 MED ORDER — LORAZEPAM 2 MG/ML IJ SOLN: 0.5000 mg | Freq: Once | INTRAMUSCULAR | Status: AC

## 2023-11-28 MED ORDER — LORAZEPAM 2 MG/ML IJ SOLN
1.0000 mg | Freq: Once | INTRAMUSCULAR | Status: AC
  Administered 2023-11-28: 1 mg via INTRAVENOUS

## 2023-11-28 MED ORDER — LORAZEPAM 2 MG/ML IJ SOLN
INTRAMUSCULAR | Status: AC
  Administered 2023-11-28: 0.5 mg via INTRAVENOUS
  Filled 2023-11-28: qty 1

## 2023-11-28 MED ORDER — LORAZEPAM 2 MG/ML IJ SOLN
0.5000 mg | Freq: Once | INTRAMUSCULAR | Status: AC
  Administered 2023-11-28: 0.5 mg via INTRAVENOUS

## 2023-11-28 MED ORDER — VALPROATE SODIUM 100 MG/ML IV SOLN
250.0000 mg | Freq: Three times a day (TID) | INTRAVENOUS | Status: DC
  Administered 2023-11-29 – 2023-12-01 (×8): 250 mg via INTRAVENOUS
  Filled 2023-11-28 (×8): qty 2.5
  Filled 2023-11-28: qty 250
  Filled 2023-11-28: qty 2.5
  Filled 2023-11-28: qty 250

## 2023-11-28 MED ORDER — SODIUM CHLORIDE 0.9 % IV BOLUS
500.0000 mL | Freq: Once | INTRAVENOUS | Status: AC
  Administered 2023-11-28: 500 mL via INTRAVENOUS

## 2023-11-28 MED ORDER — LORAZEPAM 2 MG/ML IJ SOLN
INTRAMUSCULAR | Status: AC
  Filled 2023-11-28: qty 1

## 2023-11-28 MED ORDER — LEVETIRACETAM (KEPPRA) 500 MG/5 ML ADULT IV PUSH
60.0000 mg/kg | Freq: Once | INTRAVENOUS | Status: AC
  Administered 2023-11-28: 4000 mg via INTRAVENOUS
  Filled 2023-11-28: qty 40

## 2023-11-28 MED ORDER — VALPROATE SODIUM 100 MG/ML IV SOLN
2600.0000 mg | INTRAVENOUS | Status: AC
  Administered 2023-11-28: 2600 mg via INTRAVENOUS
  Filled 2023-11-28: qty 26

## 2023-11-28 MED ORDER — IOHEXOL 350 MG/ML SOLN
75.0000 mL | Freq: Once | INTRAVENOUS | Status: AC | PRN
  Administered 2023-11-28: 75 mL via INTRAVENOUS

## 2023-11-28 NOTE — ED Notes (Signed)
 Family called out stating that pt was becoming agitated. Provider notified and at bedside.

## 2023-11-28 NOTE — ED Notes (Signed)
 Pt received a total of 2mg  of Ativan. [facility] at bedside waiting to transport pt once pt is less agitated.

## 2023-11-28 NOTE — Consult Note (Addendum)
 [facility] Telemedicine Consult   Requesting Provider: Clarine HERO Consult Participants: Bedside nursing, patient Location of the provider: [place], [place] Location of the patient: [facility]  This consult was provided via telemedicine with 2-way video and audio communication. The patient/family was informed that care would be provided in this way and agreed to receive care in this manner.    Chief Complaint: Seizures  HPI: 83 yo F with a cold for a couple of months. She fell asleep in the lounge chair and then started jerking. He tried to wake her up and she wouldn't respond. His daughter came over and she was still unresponsive so they called EMS. She had recurrent seizure activity with EMS and was given versed 5mg  IM. She was noted to be flaccid on the left and so a code stroke was called.   I did confirm DNR/DIN status with the patient's husband and daughter, full treatment to that point.     LKW: 6:30 tnk given?: No, seizure at onset IR Thrombectomy? No, no LVO Modified Rankin Scale: 0-Completely asymptomatic and back to baseline post- stroke Time of teleneurologist evaluation: 19:22  Exam: There were no vitals filed for this visit.  General: in bed, nasal trumpet in place  1A: Level of Consciousness - 2 1B: Ask Month and Age - 2 1C: 'Blink Eyes' & 'Squeeze Hands' - 2 2: Test Horizontal Extraocular Movements - 2 3: Test Visual Fields - 3 4: Test Facial Palsy - 1 5A: Test Left Arm Motor Drift - 4 5B: Test Right Arm Motor Drift - 2 6A: Test Left Leg Motor Drift - 3 6B: Test Right Leg Motor Drift - 2 7: Test Limb Ataxia - 0 8: Test Sensation - 2 9: Test Language/Aphasia- 3 10: Test Dysarthria - 2 11: Test Extinction/Inattention - 0 NIHSS score: 30   Imaging Reviewed: CT/CTA - negative  Labs reviewed in epic and pertinent values follow: CBC unremarkable   Assessment: 83 yo F with new onset focal status epilepticus with persistent left gaze and left sided  weakness. I feel this is more consistent with seizure with ongoing status than stroke. Her mental status is tenuous and she has already received versed with EMS and ativan here, I would favor proceeding with less sedating AEDs, especially since she is DNRI/DNI.   With no prodrome of headaches or confusion, no fever and no leukocytosis, I have a very low suspicion for infectious etiology.   I have low suspicion for stroke, but since she is still symptomatic and within the TNK window, I will get an MRI to rule out acute stroke.   Recommendations:  1) keppra 60 mg/kg 2) STAT MRI brain to rule out stroke, consider TNK if positive 3) if no improvement with keppra, will load with depacon 40 mg/kg 4) would transfer to [facility] for LTM EEG. 5) f/u electrolytes and magnesium, replete as needed   This patient is critically ill and at significant risk of neurological worsening, death and care requires constant monitoring of vital signs, hemodynamics,respiratory and cardiac monitoring, neurological assessment, discussion with family, other specialists and medical decision making of high complexity. I spent 56 minutes of neurocritical care time  in the care of  this patient. This was time spent independent of any time provided by nurse practitioner or PA.  Aisha Seals, MD [facility]   If 7pm- 7am, please page neurology on call as listed in AMION. 11/28/2023  8:18 PM

## 2023-11-28 NOTE — ED Triage Notes (Signed)
 PT BIB [facility] from home around 1830 this evening family noted that pt began to slur speech then had a seizure. Pt has no known seizure history. Upon EMS arrival to pt pt was unresponsive and post ictal EMS gave pt 5mg  versed, 2.5 mg in bilateral Deltoid. Upon arrival to the ED pt was unresponsive and responding to painful stimuli. Pt neglecting Lt side.

## 2023-11-28 NOTE — Progress Notes (Signed)
 Briefly evaluated Ms. Frances Phillips at the bedside. She is encephalopathic, eyes closed, agitated, moving all extremities vigorously, spontaneously and antigravity on purpose. Attempting to sit up and get out of the bed. She is moaning and groaning loudly. Careful inspection does not demonstrate any jerking or twitching clinically concerning for seizure, there is no gaze deviation noted upon forcing her eyes open.  I dont think she needs an ICU bed at this time. Recommend progressive bed. Will put her up on cEEgg in AM. Use ativan 1-2mg  for any seizure.  Given her agitation, I am going to put her on maintenance valproic acid instead of keppra. Will get VPA levels in AM.  Plan discussed with Burnard light with ED team.  Quentavious Rittenhouse [facility]

## 2023-11-28 NOTE — ED Provider Notes (Signed)
 [facility] Provider Note    Event Date/Time   First MD Initiated Contact with Patient 11/28/23 1915     (approximate)   History   Code Stroke  PT BIB [facility] from home around 1830 this evening family noted that pt began to slur speech then had a seizure. Pt has no known seizure history. Upon EMS arrival to pt pt was unresponsive and post ictal EMS gave pt 5mg  versed, 2.5 mg in bilateral Deltoid. Upon arrival to the ED pt was unresponsive and responding to painful stimuli. Pt neglecting Lt side.    HPI Frances Phillips is a 83 y.o. female PMH hypertension, celiac disease, hyperlipidemia, CKD presents for evaluation after witnessed seizure -Per EMS, around 6:30 PM patient was with family, noted to start slurring her speech and then had a witnessed seizure.  Unclear if there was focality to this.  Originally called out to EMS as a cardiac arrest though patient had a pulse on their arrival.  Patient subsequently had recurrent seizure with EMS, they gave 5 mg IM Versed and brought to the hospital.  Was satting at 90% on room air.  Nasal trumpet and nasal cannula placed with improvement in oxygenation.  Glucose 140s.  No known seizure history.  No known history of anticoagulation.  No trauma associated with the event, was apparently sitting in a chair and was laid down on ground by family. -On my evaluation, patient does moan and grimace to painful stimuli.  Is spontaneously moving her right arm      Physical Exam   Triage Vital Signs: ED Triage Vitals  Encounter Vitals Group     BP      Girls Systolic BP Percentile      Girls Diastolic BP Percentile      Boys Systolic BP Percentile      Boys Diastolic BP Percentile      Pulse      Resp      Temp      Temp src      SpO2      Weight      Height      Head Circumference      Peak Flow      Pain Score      Pain Loc      Pain Education      Exclude from Growth Chart     Most recent vital signs: Vitals:    11/28/23 2230 11/28/23 2235  BP: (!) 155/129 (!) 155/129  Pulse: 80 80  Resp:  20  Temp: 99.2 F (37.3 C) 99.2 F (37.3 C)  SpO2: 100% 100%     General: Awake, no distress.  HEENT: Normocephalic, atraumatic, pupils responsive bilaterally.  Nasal trumpet in place. CV:  Good peripheral perfusion. RRR, RP 2+ Resp:  Nasal trumpet in place, breathing spontaneously, lungs clear to auscultation anteriorly bilaterally Abd:  No distention. Nontender to deep palpation throughout Neuro:  Obtunded, does moan to painful stimuli, spontaneously moving her right arm and right leg, left leg does withdraw from pain in the left arm with no movement and does not withdraw from painful stimuli.  Not otherwise interactive with neurologic exam.   ED Results / Procedures / Treatments   Labs (all labs ordered are listed, but only abnormal results are displayed) Labs Reviewed  CBC - Abnormal; Notable for the following components:      Result Value   RBC 3.36 (*)    Hemoglobin 10.3 (*)  HCT 30.8 (*)    RDW 15.9 (*)    All other components within normal limits  DIFFERENTIAL - Abnormal; Notable for the following components:   Abs Immature Granulocytes 0.08 (*)    All other components within normal limits  COMPREHENSIVE METABOLIC PANEL WITH GFR - Abnormal; Notable for the following components:   CO2 17 (*)    Glucose, Bld 125 (*)    Creatinine, Ser 1.22 (*)    Calcium 8.8 (*)    AST 42 (*)    GFR, Estimated 45 (*)    All other components within normal limits  URINE DRUG SCREEN, QUALITATIVE ([facility] ONLY) - Abnormal; Notable for the following components:   Benzodiazepine, Ur Scrn POSITIVE (*)    All other components within normal limits  CBG MONITORING, ED - Abnormal; Notable for the following components:   Glucose-Capillary 111 (*)    All other components within normal limits  PROTIME-INR  APTT  MAGNESIUM     EKG  See ED course below   RADIOLOGY Radiology interpreted by myself and  radiology report reviewed.  No acute pathology identified, no clear evidence of CVA.    PROCEDURES:  Critical Care performed: Yes, see critical care procedure note(s)  .Critical Care  Performed by: Clarine Ozell LABOR, MD Authorized by: Clarine Ozell LABOR, MD   Critical care provider statement:    Critical care time (minutes):  90   Critical care time was exclusive of:  Separately billable procedures and treating other patients   Critical care was necessary to treat or prevent imminent or life-threatening deterioration of the following conditions:  CNS failure or compromise   Critical care was time spent personally by me on the following activities:  Development of treatment plan with patient or surrogate, discussions with consultants, evaluation of patient's response to treatment, examination of patient, ordering and review of laboratory studies, ordering and review of radiographic studies, ordering and performing treatments and interventions, pulse oximetry, re-evaluation of patient's condition and review of old charts   I assumed direction of critical care for this patient from another provider in my specialty: no     Care discussed with: admitting provider      MEDICATIONS ORDERED IN ED: Medications  levETIRAcetam (KEPPRA) undiluted injection 4,000 mg (4,000 mg Intravenous Given 11/28/23 1947)  iohexol (OMNIPAQUE) 350 MG/ML injection 75 mL (75 mLs Intravenous Contrast Given 11/28/23 1934)  LORazepam (ATIVAN) injection 1 mg (1 mg Intravenous Given 11/28/23 1939)  valproate (DEPACON) 2,600 mg in dextrose 5 % 50 mL IVPB (0 mg Intravenous Stopped 11/28/23 2250)  sodium chloride  0.9 % bolus 500 mL (0 mLs Intravenous Stopped 11/28/23 2250)  LORazepam (ATIVAN) injection 0.5 mg (0.5 mg Intravenous Given 11/28/23 2214)  LORazepam (ATIVAN) injection 0.5 mg (0.5 mg Intravenous Given 11/28/23 2215)  LORazepam (ATIVAN) injection 1 mg (1 mg Intravenous Given 11/28/23 2225)     IMPRESSION / MDM /  ASSESSMENT AND PLAN / ED COURSE  I reviewed the triage vital signs and the nursing notes.                              DDX/MDM/AP: Differential diagnosis includes, but is not limited to, high concern for CVA given reported slurred speech shortly prior to new onset seizure as well as apparent left arm paralysis on my initial eval, glucose normal--code stroke activated during my evaluation on EMS arrival.  Nasal trumpet in place, is breathing spontaneously with nasal cannula, obtunded  but currently protecting airway--will continue to monitor airway closely, low threshold to escalate to intubation though I believe this would be beneficial to allow neurology to perform a neurologic exam and patient is currently protecting her airway.  Was apparently mildly hypoxic on scene, consider possible aspiration.  Also consider status epilepticus given recurrent seizures.  Eval for underlying electrolyte derangement, infection.  No clear cardiac etiology at this time and does sound as if patient has been having recurrent seizures.  Plan: - Code stroke activated - Emergent CT head and neurology consult - Close airway monitoring, low threshold to intubate - Labs - EKG - Cardiac monitor  Patient's presentation is most consistent with acute presentation with potential threat to life or bodily function.  The patient is on the cardiac monitor to evaluate for evidence of arrhythmia and/or significant heart rate changes.  ED course below.  No evidence of stroke on imaging including MRI though neurology suspects patient is indeed status epilepticus.  Loaded with 60 mg/kg of Keppra and received 1 mg IV lorazepam.  Family confirms patient is DNR/DNI.  Amenable to transfer to [facility] where continuous EEG can be performed per neurology recommendation.  Initially hoped for a progressive bed, however this unit was completely full.  I spoke with ICU who accepted patient.  Subsequently found that only 1 ICU bed is available  and given her DNR/DNI status wondered if this was an appropriate use of resources, discussed with neurologist that excepting facility within spoke with ICU, ultimately felt ED to ED transfer was most appropriate initial step, will initiate EEG there, can determine if she should be placed in ICU when she arrives at their facility and this testing is started.  Patient did require serial reevaluations of mental status and did later become somewhat combative in the ED, did receive 2 doses of 0.5 mg lorazepam followed by a further dose of 1 mg lorazepam to facilitate her transfer by ALS to [facility].  No significant underlying electrolyte derangement nor obvious infection.  Unclear precipitant of seizure episodes today.  Family updated on plan bedside.  Clinical Course as of 11/28/23 2338  Fri Nov 28, 2023  8085 Code stroke activated Witnessed seizure, no prior seizure history, reportedly started slurring speech beforehand Glucose 140s Second seizure with EMS, received total 5 mg IM Versed Nasal trumpet in place On my evaluation, patient is not moving her left arm at all and is not responding to painful stimuli in that extremity  Spontaneously moving upper and lower extremities [MM]  1950 D/w Dr. Michaela of neuro Checks more likely status epilepticus though would like MRI brain to ensure no CVA  Confirmed DNR/DNI status with family  Anticipate likely need to transfer to [facility] for continuous EEG  Loading with Keppra 60 mg/kg [MM]  1953 Ecg = sinus rhythm, rate 82, no gross ST elevation, trace ST depression limited to lead to (nonspecific), left axis deviation, normal intervals.  No arrhythmia on my interpretation. [MM]  2022 CBC with no leukocytosis, mild anemia [MM]  2022 CXR: IMPRESSION: No active disease.   [MM]  2041 D/w Dr. Lenora of neuro -no evidence of CVA on MRI - stat depakon load, neurology ordering -  transfer - progressive level of care appropriate, if no  progressive would recommend ICU transfer [MM]  2051 CMP reviewed, bicarb somewhat low, mild bump in creatinine, will give IV fluid  No severe electrolyte derangements [MM]  2058 Discussed with neurology at [facility] as well as [facility]  transfer center, they do not have progressive beds available, will plan on ICU transfer, if no ICU we will plan for ED to ED [MM]  2105 Accepting Dr. Orlin Fairly of ICU accepting Family updated [MM]  2127 In consultation with ICU and neurology, ultimately decided to do ED to ED transfer  Dr. Pamella of ED accepting  [facility] planning for next available ALS transport [MM]    Clinical Course User Index [MM] Clarine Ozell LABOR, MD     FINAL CLINICAL IMPRESSION(S) / ED DIAGNOSES   Final diagnoses:  Seizure (HCC)  Status epilepticus (HCC)     Rx / DC Orders   ED Discharge Orders     None        Note:  This document was prepared using Dragon voice recognition software and may include unintentional dictation errors.   Clarine Ozell LABOR, MD 11/28/23 [phone]

## 2023-11-28 NOTE — Progress Notes (Signed)
   11/28/23 1918  Spiritual Encounters  Type of Visit Initial  Care provided to: Family  Conversation partners present during encounter Nurse;Physician  Reason for visit Code  OnCall Visit Yes   Called to ED for Code Stroke.  Provided support to family in lobby and in patient room until patient transferred to [facility].

## 2023-11-28 NOTE — ED Provider Notes (Signed)
 [place] [facility]MENT AT [facility] Provider Note   CSN: [id number] Arrival date & time: 11/28/23  2334     Patient presents with: Altered Mental Status   Frances Phillips is a 83 y.o. female.   Level 5 caveat 2/2 AMS  Patient is an 83 y/o female with hx of HTN, HLD, CKD presenting in transfer from [facility] for concern of being in status with evidence of focal seizure-like activity. She was brought to OSH via EMS from home after family noted that pt began to slur speech then had a seizure around 1830 tonight. No prior hx of seizures.  Stat MRI completed at OSH c/w changes secondary to seizure activity. Has received IV Keppra infusion (60mg /kg) and IV Depakote load prior to transfer in addition to 2mg  IV Ativan for agitation. In need of continuous EEG monitoring. Currently appears agitated, protecting airway.  The history is provided by medical records.  Altered Mental Status      Prior to Admission medications   Medication Sig Start Date End Date Taking? Authorizing Provider  allopurinol (ZYLOPRIM) 300 MG tablet Take 300 mg by mouth daily.   Yes [provider]  amLODipine-valsartan (EXFORGE) 10-320 MG tablet Take 1 tablet by mouth daily. 11/04/22  Yes [provider]  cyanocobalamin 1000 MCG tablet Take 1,000 mcg by mouth daily.   Yes [provider]  diclofenac Sodium (VOLTAREN) 1 % GEL diclofenac 1 % topical gel  APPLY TWO GRAMS TO THE AFFECTED AREA(S) FOUR TIMES DAILY   Yes [provider]  levothyroxine (SYNTHROID) 75 MCG tablet Take 75 mcg by mouth daily before breakfast.   Yes [provider]  lisinopril (PRINIVIL,ZESTRIL) 40 MG tablet Take 40 mg by mouth daily. 05/23/16  Yes [provider]  memantine (NAMENDA) 5 MG tablet Take 5 mg by mouth 2 (two) times daily. 07/24/22  Yes [provider]  omeprazole (PRILOSEC) 20 MG capsule Take 20 mg by mouth daily.   Yes [provider]  ondansetron   (ZOFRAN -ODT) 8 MG disintegrating tablet Take 1 tablet (8 mg total) by mouth every 8 (eight) hours as needed for nausea or vomiting. 12/31/22  Yes Bernardino Ditch, NP  pravastatin (PRAVACHOL) 40 MG tablet Take 40 mg by mouth every morning. 05/23/16  Yes [provider]  valsartan (DIOVAN) 160 MG tablet Take 160 mg by mouth daily. 09/08/23  Yes [provider]  venlafaxine (EFFEXOR) 75 MG tablet Take 75 mg by mouth daily.   Yes [provider]    Allergies: Gluten meal and Percocet [oxycodone-acetaminophen]    Review of Systems Ten systems reviewed and are negative for acute change, except as noted in the HPI.    Updated Vital Signs BP (!) 175/52   Pulse 84   Temp 99.1 F (37.3 C)   Resp (!) 22   SpO2 99%   Physical Exam Vitals and nursing note reviewed.  Constitutional:      Appearance: She is well-developed.     Comments: Agitated in the bed. Unable to be redirected.  HENT:     Head: Normocephalic and atraumatic.  Eyes:     General: No scleral icterus.    Conjunctiva/sclera: Conjunctivae normal.  Cardiovascular:     Rate and Rhythm: Normal rate and regular rhythm.     Pulses: Normal pulses.  Pulmonary:     Effort: Pulmonary effort is normal. No respiratory distress.  Abdominal:     Palpations: Abdomen is soft.  Genitourinary:    Comments: Foley  catheter in place. Musculoskeletal:        General: Normal range of motion.  Skin:    General: Skin is warm and dry.     Coloration: Skin is not pale.     Findings: No erythema or rash.  Neurological:     Comments: Agitated with eyes closed. Speech is nonsensical. Will not follow commands, but moving all extremities spontaneously and symmetrically.  Psychiatric:        Behavior: Behavior is agitated.     (all labs ordered are listed, but only abnormal results are displayed) Labs Reviewed  CBC WITH DIFFERENTIAL/PLATELET - Abnormal; Notable for the following components:      Result Value   WBC 11.2 (*)     RBC 2.89 (*)    Hemoglobin 8.8 (*)    HCT 26.1 (*)    Neutro Abs 9.3 (*)    Abs Immature Granulocytes 0.08 (*)    All other components within normal limits  COMPREHENSIVE METABOLIC PANEL WITH GFR - Abnormal; Notable for the following components:   CO2 20 (*)    Glucose, Bld 105 (*)    Creatinine, Ser 1.17 (*)    Calcium 8.1 (*)    Total Protein 5.6 (*)    Albumin 3.3 (*)    GFR, Estimated 47 (*)    All other components within normal limits  VALPROIC ACID LEVEL    EKG: None  Radiology: MR BRAIN WO CONTRAST Result Date: 11/28/2023 EXAM: MR Brain without Intravenous Contrast. CLINICAL HISTORY: Neuro deficit, acute, stroke suspected. Table formatting from the original note was not included.; Neuro deficit, acute, stroke suspected ; CODE STROKE- only code stroke sequences run. TECHNIQUE: Magnetic resonance images of the brain without intravenous contrast in multiple planes. CONTRAST: Without. COMPARISON: Same day CT head. FINDINGS: Limited protocol due to provider request. T2 not performed. BRAIN: Subtle cortical restricted diffusion in the posterior right temporal and parietal lobes. No significant edema. No intracranial mass or hemorrhage. No midline shift or extra-axial fluid collection. The central arterial and venous flow voids are patent. VENTRICLES: No hydrocephalus. ORBITS: The orbits are normal. SINUSES AND MASTOIDS: The sinuses and mastoid air cells are clear. BONES: No acute fracture or focal osseous lesion. IMPRESSION: 1. Subtle cortical restricted diffusion in the posterior right temporal and parietal lobes, most likely secondary to reported seizures. No significant edema. Appearance and location is atypical for infarct. Electronically signed by: Gilmore Molt MD 11/28/2023 09:02 PM EDT RP Workstation: [id number]   DG Chest Portable 1 View Result Date: 11/28/2023 CLINICAL DATA:  Seizure, hypoxia EXAM: PORTABLE CHEST 1 VIEW COMPARISON:  12/09/2010 FINDINGS: Heart and  mediastinal contours are within normal limits. No focal opacities or effusions. No acute bony abnormality. Aortic atherosclerosis. IMPRESSION: No active disease. Electronically Signed   By: Franky Crease M.D.   On: 11/28/2023 20:06   CT ANGIO HEAD NECK W WO CM (CODE STROKE) Result Date: 11/28/2023 EXAM: CT HEAD WITHOUT CTA HEAD AND NECK WITH AND WITHOUT 11/28/2023 07:35:19 PM TECHNIQUE: CTA of the head and neck was performed with and without the administration of intravenous contrast (75 mL iohexol (OMNIPAQUE) 350 MG/ML injection). Noncontrast CT of the head with reconstructed 2-D images are also provided for review. Multiplanar 2D and/or 3D reformatted images are provided for review. Automated exposure control, iterative reconstruction, and/or weight based adjustment of the mA/kV was utilized to reduce the radiation dose to as low as reasonably achievable. COMPARISON: None available CLINICAL HISTORY: Neuro deficit, acute, stroke suspected. Code stroke; Left side  weakness unresponsive; Lkw 1830 FINDINGS: CT HEAD: BRAIN AND VENTRICLES: No acute intracranial hemorrhage. No mass effect. No midline shift. No extra-axial fluid collection. No evidence of acute infarct. No hydrocephalus. ORBITS: No acute abnormality. SINUSES AND MASTOIDS: Nasal airway is present on the right. CTA NECK: AORTIC ARCH AND ARCH VESSELS: Atherosclerotic calcifications are present at the aortic arch and grade and the origin of left subclavian artery to the rather without focal stenosis or aneurysm. Atherosclerotic calcifications are present at the origin of the right subclavian artery. CERVICAL CAROTID ARTERIES: Atherosclerotic calcifications are present at the carotid bifurcations bilaterally without significant stenosis. Mild tortuosity is present in the right common and internal carotid artery without focal stenosis. CERVICAL VERTEBRAL ARTERIES: No dissection, arterial injury, or significant stenosis. LUNGS AND MEDIASTINUM: Unremarkable.  SOFT TISSUES: Minimal calcification is present at the right ECA origin without focal stenosis. BONES: No acute abnormality. CTA HEAD: ANTERIOR CIRCULATION: No significant stenosis of the internal carotid arteries. No significant stenosis of the anterior cerebral arteries. No significant stenosis of the middle cerebral arteries. No aneurysm. POSTERIOR CIRCULATION: No significant stenosis of the posterior cerebral arteries. No significant stenosis of the basilar artery. No significant stenosis of the vertebral arteries. No aneurysm. OTHER: No dural venous sinus thrombosis on this non-dedicated study. IMPRESSION: 1. No acute intracranial or vascular abnormality. 2. Atherosclerotic calcifications at the aortic arch, subclavian origins, and carotid bifurcations without significant stenosis. 3. Mild tortuosity of the right common and internal carotid arteries without focal stenosis. Electronically signed by: Lonni Necessary MD 11/28/2023 07:45 PM EDT RP Workstation: [id number]   CT HEAD CODE STROKE WO CONTRAST (LKW 0-4.5h, LVO 0-24h) Result Date: 11/28/2023 EXAM: CT HEAD WITHOUT CONTRAST 11/28/2023 07:29:18 PM TECHNIQUE: CT of the head was performed without the administration of intravenous contrast. Automated exposure control, iterative reconstruction, and/or weight based adjustment of the mA/kV was utilized to reduce the radiation dose to as low as reasonably achievable. COMPARISON: None available. CLINICAL HISTORY: Neuro deficit, acute, stroke suspected. Code stroke; Seizures; unresponsive, not moving left side; LKW: 1830. FINDINGS: BRAIN AND VENTRICLES: No acute hemorrhage. No evidence of acute infarct. No hydrocephalus. No extra-axial collection. No mass effect or midline shift. [place] stroke program early CT (ASPECT) score: Ganglionic (caudate, IC, lentiform nucleus, insula, M1-M3): 7 Supraganglionic (M4-M6): 3 Total: 10 ORBITS: Bilateral lens replacements are noted. The globes and orbits are otherwise  within normal limits. SINUSES: Nasal airway is present. SOFT TISSUES AND SKULL: No acute soft tissue abnormality. No skull fracture. IMPRESSION: 1. No acute intracranial abnormality. The pertinent results were texted to Dr. Michaela via the Amion system at 07:34 pm. Electronically signed by: Lonni Necessary MD 11/28/2023 07:34 PM EDT RP Workstation: [id number]     .Critical Care  Performed by: Keith Sor, PA-C Authorized by: Keith Sor, PA-C   Critical care provider statement:    Critical care time (minutes):  30   Critical care time was exclusive of:  Separately billable procedures and treating other patients   Critical care was necessary to treat or prevent imminent or life-threatening deterioration of the following conditions:  CNS failure or compromise (status epilepticus)   Critical care was time spent personally by me on the following activities:  Development of treatment plan with patient or surrogate, discussions with consultants, evaluation of patient's response to treatment, examination of patient, ordering and review of laboratory studies, ordering and review of radiographic studies, ordering and performing treatments and interventions, pulse oximetry, re-evaluation of patient's condition and review of old charts   Care discussed  with: admitting provider      Medications Ordered in the ED  valproate (DEPACON) 250 mg in dextrose 5 % 50 mL IVPB (has no administration in time range)  LORazepam (ATIVAN) injection 2 mg (2 mg Intravenous Given 11/29/23 0142)  LORazepam (ATIVAN) injection 1 mg (has no administration in time range)  sodium chloride  0.9 % bolus 1,000 mL (0 mLs Intravenous Stopped 11/29/23 0438)  LORazepam (ATIVAN) injection 1 mg (1 mg Intravenous Given 11/29/23 0218)    Clinical Course as of 11/29/23 0523  Fri Nov 28, 2023  2357 Soft restraints ordered for safety given degree of agitation. C/w suspected postictal state. [KH]  2358 Dr. Vanessa of Neurology  has evaluated patient at bedside. Recommends admission to SDU. Will proceed with continuous EEG monitoring in AM. [KH]  Sat Nov 29, 2023  0001 Nasal trumpet removed as patient protecting airway.  [KH]  0007 Consult placed to [facility] for admission. [KH]  0207 Patient continually agitated despite 2 mg of Ativan being given approximately 30 minutes ago.  Will give an additional 1 mg of Ativan and reassess.  Her acidosis (presumed lactic acidosis) appears to be improving.  Bicarb now 20.  Anion gap remains normal. IVF have been given. [KH]  0522 Agitation temporarily improved, but has subsequently worsened again.  Will give additional 1 mg Ativan.  Also due for Depakote at 0600 which should ideally help improved agitated state. [KH]    Clinical Course User Index [KH] Keith Sor, PA-C                                 Medical Decision Making Amount and/or Complexity of Data Reviewed Labs: ordered.  Risk Prescription drug management. Decision regarding hospitalization.   This patient presents to the ED for concern of AMS and seizure activity, this involves an extensive number of treatment options, and is a complaint that carries with it a high risk of complications and morbidity.  The differential diagnosis includes ICH vs CVA vs meningitis vs electrolyte derangement    Co morbidities that complicate the patient evaluation  HTN HLD CKD   Additional history obtained:  Additional history obtained from EMS transport External records from outside source obtained and reviewed including MRI from OSH, completed yesterday   Lab Tests:  I Ordered, and personally interpreted labs.  The pertinent results include:  WBC 11.2 (c/w status epilepticus), Hgb 8.8 (10.3 upon presentation at [facility]), CO2 20 (improving), Creatinine 1.17   Cardiac Monitoring:  The patient was maintained on a cardiac monitor.  I personally viewed and interpreted the cardiac monitored which showed an underlying rhythm of:  NSR   Medicines ordered and prescription drug management:  I ordered medication including Ativan for agitation  Reevaluation of the patient after these medicines showed that the patient stayed the same I have reviewed the patients home medicines and have made adjustments as needed   Critical Interventions:  Multiple rounds of IV benzo's for agitation Continuous EEG   Consultations Obtained:  I requested consultation with Dr. Vanessa of Neurology and discussed lab and imaging findings as well as pertinent plan - they recommend: admission to SDU pending continuous EEG. Protecting airway and given code status, not deemed necessary to admit to ICU at this time.   Problem List / ED Course:  As above   Reevaluation:  After the interventions noted above, I reevaluated the patient and found that they have :stayed the same  Dispostion:  After consideration of the diagnostic results and the patients response to treatment, I feel that the patent would benefit from admission for continuous EEG monitoring and observation until patient back to known baseline.  Patient code status: DNR/DNI       Final diagnoses:  Status epilepticus Frances Phillips)    ED Discharge Orders     None          Keith Sor, PA-C 11/29/23 0528    Darra Fonda MATSU, MD 11/30/23 [phone]

## 2023-11-28 NOTE — ED Provider Notes (Incomplete)
 [place] EMERGENCY DEPARTMENT AT [facility] Provider Note   CSN: [id number] Arrival date & time: 11/28/23  2334     Patient presents with: Altered Mental Status   Frances Phillips is a 83 y.o. female.  {Add pertinent medical, surgical, social history, OB history to HPI:32947} Level 5 caveat 2/2 AMS  Patient is an 83 y/o female with hx of HTN, HLD, CKD presenting in transfer from [facility] for concern of being in status with evidence of focal seizure-like activity. She was brought to OSH via EMS from home after family noted that pt began to slur speech then had a seizure around 1830 tonight. No prior hx of seizures.  Stat MRI completed at OSH c/w changes secondary to seizure activity. Has received IV Keppra infusion (60mg /kg) and IV Depakote load prior to transfer in addition to 2mg  IV Ativan for agitation. In need of continuous EEG monitoring. Currently appears agitated, protecting airway.  The history is provided by medical records.  Altered Mental Status      Prior to Admission medications   Medication Sig Start Date End Date Taking? Authorizing Provider  albuterol  (VENTOLIN  HFA) 108 (90 Base) MCG/ACT inhaler Inhale 1-2 puffs into the lungs every 4 (four) hours as needed for wheezing or shortness of breath. Patient not taking: Reported on 10/27/2023 01/31/19   Gray, Bryan E, NP  allopurinol (ZYLOPRIM) 300 MG tablet Take 300 mg by mouth daily.    [provider]  amLODipine-valsartan (EXFORGE) 10-320 MG tablet Take 1 tablet by mouth daily. 11/04/22   [provider]  benzonatate  (TESSALON ) 100 MG capsule Take 2 capsules (200 mg total) by mouth every 8 (eight) hours. Patient not taking: Reported on 10/27/2023 03/03/21   Bernardino Ditch, NP  budesonide (ENTOCORT EC) 3 MG 24 hr capsule Take 9 mg by mouth every morning. Patient not taking: Reported on 10/27/2023 12/27/20   [provider]  cyanocobalamin 1000 MCG tablet Take by mouth.    [provider]   diclofenac Sodium (VOLTAREN) 1 % GEL diclofenac 1 % topical gel  APPLY TWO GRAMS TO THE AFFECTED AREA(S) FOUR TIMES DAILY    [provider]  EXELON 9.5 MG/24HR 9.5 mg daily. Patient not taking: Reported on 10/27/2023 07/24/22   [provider]  gabapentin (NEURONTIN) 100 MG capsule gabapentin 100 mg capsule Patient not taking: Reported on 10/27/2023    [provider]  ipratropium (ATROVENT ) 0.06 % nasal spray Place 2 sprays into both nostrils 4 (four) times daily. Patient not taking: Reported on 10/27/2023 03/03/21   Bernardino Ditch, NP  levothyroxine (SYNTHROID, LEVOTHROID) 100 MCG tablet Take 100 mcg by mouth daily before breakfast.    [provider]  lisinopril (PRINIVIL,ZESTRIL) 40 MG tablet  05/23/16   [provider]  memantine (NAMENDA) 5 MG tablet Take 5 mg by mouth 2 (two) times daily. 07/24/22   [provider]  nitrofurantoin , macrocrystal-monohydrate, (MACROBID ) 100 MG capsule Take 1 capsule (100 mg total) by mouth 2 (two) times daily. Patient not taking: Reported on 10/27/2023 08/07/22   Brimage, Vondra, DO  omeprazole (PRILOSEC) 20 MG capsule omeprazole 20 mg capsule,delayed release    [provider]  ondansetron  (ZOFRAN -ODT) 8 MG disintegrating tablet Take 1 tablet (8 mg total) by mouth every 8 (eight) hours as needed for nausea or vomiting. Patient not taking: Reported on 10/27/2023 12/31/22   Bernardino Ditch, NP  oseltamivir  (TAMIFLU ) 75 MG capsule Take 1 capsule (75 mg total) by mouth daily. Patient not taking: Reported  on 10/27/2023 02/20/22   Teresa Shelba SAUNDERS, NP  phenazopyridine  (PYRIDIUM ) 200 MG tablet Take 1 tablet (200 mg total) by mouth 3 (three) times daily. Patient not taking: Reported on 10/27/2023 09/30/21   Bernardino Ditch, NP  pravastatin (PRAVACHOL) 40 MG tablet  05/23/16   [provider]  predniSONE  (DELTASONE ) 20 MG tablet Take 2 tablets (40 mg total) by mouth daily. Patient not taking: Reported on 10/27/2023 02/20/22    Teresa Shelba SAUNDERS, NP  venlafaxine (EFFEXOR) 37.5 MG tablet Take 37.5 mg by mouth 2 (two) times daily.    [provider]    Allergies: Gluten meal and Percocet [oxycodone-acetaminophen]    Review of Systems Ten systems reviewed and are negative for acute change, except as noted in the HPI.    Updated Vital Signs BP (!) 129/117   Pulse 81   Temp 99.5 F (37.5 C) (Axillary)   Resp 12   SpO2 99%   Physical Exam Vitals and nursing note reviewed.  Constitutional:      Appearance: She is well-developed.     Comments: Agitated in the bed. Unable to be redirected.  HENT:     Head: Normocephalic and atraumatic.  Eyes:     General: No scleral icterus.    Conjunctiva/sclera: Conjunctivae normal.  Cardiovascular:     Rate and Rhythm: Normal rate and regular rhythm.     Pulses: Normal pulses.  Pulmonary:     Effort: Pulmonary effort is normal. No respiratory distress.  Abdominal:     Palpations: Abdomen is soft.  Genitourinary:    Comments: Foley catheter in place. Musculoskeletal:        General: Normal range of motion.  Skin:    General: Skin is warm and dry.     Coloration: Skin is not pale.     Findings: No erythema or rash.  Neurological:     Comments: Agitated with eyes closed. Speech is nonsensical. Will not follow commands, but moving all extremities spontaneously and symmetrically.  Psychiatric:        Behavior: Behavior is agitated.     (all labs ordered are listed, but only abnormal results are displayed) Labs Reviewed  VALPROIC ACID LEVEL    EKG: None  Radiology: MR BRAIN WO CONTRAST Result Date: 11/28/2023 EXAM: MR Brain without Intravenous Contrast. CLINICAL HISTORY: Neuro deficit, acute, stroke suspected. Table formatting from the original note was not included.; Neuro deficit, acute, stroke suspected ; CODE STROKE- only code stroke sequences run. TECHNIQUE: Magnetic resonance images of the brain without intravenous contrast in multiple planes.  CONTRAST: Without. COMPARISON: Same day CT head. FINDINGS: Limited protocol due to provider request. T2 not performed. BRAIN: Subtle cortical restricted diffusion in the posterior right temporal and parietal lobes. No significant edema. No intracranial mass or hemorrhage. No midline shift or extra-axial fluid collection. The central arterial and venous flow voids are patent. VENTRICLES: No hydrocephalus. ORBITS: The orbits are normal. SINUSES AND MASTOIDS: The sinuses and mastoid air cells are clear. BONES: No acute fracture or focal osseous lesion. IMPRESSION: 1. Subtle cortical restricted diffusion in the posterior right temporal and parietal lobes, most likely secondary to reported seizures. No significant edema. Appearance and location is atypical for infarct. Electronically signed by: Gilmore Molt MD 11/28/2023 09:02 PM EDT RP Workstation: [id number]   DG Chest Portable 1 View Result Date: 11/28/2023 CLINICAL DATA:  Seizure, hypoxia EXAM: PORTABLE CHEST 1 VIEW COMPARISON:  12/09/2010 FINDINGS: Heart and mediastinal contours are within normal limits. No focal opacities or effusions.  No acute bony abnormality. Aortic atherosclerosis. IMPRESSION: No active disease. Electronically Signed   By: Franky Crease M.D.   On: 11/28/2023 20:06   CT ANGIO HEAD NECK W WO CM (CODE STROKE) Result Date: 11/28/2023 EXAM: CT HEAD WITHOUT CTA HEAD AND NECK WITH AND WITHOUT 11/28/2023 07:35:19 PM TECHNIQUE: CTA of the head and neck was performed with and without the administration of intravenous contrast (75 mL iohexol (OMNIPAQUE) 350 MG/ML injection). Noncontrast CT of the head with reconstructed 2-D images are also provided for review. Multiplanar 2D and/or 3D reformatted images are provided for review. Automated exposure control, iterative reconstruction, and/or weight based adjustment of the mA/kV was utilized to reduce the radiation dose to as low as reasonably achievable. COMPARISON: None available CLINICAL HISTORY:  Neuro deficit, acute, stroke suspected. Code stroke; Left side weakness unresponsive; Lkw 1830 FINDINGS: CT HEAD: BRAIN AND VENTRICLES: No acute intracranial hemorrhage. No mass effect. No midline shift. No extra-axial fluid collection. No evidence of acute infarct. No hydrocephalus. ORBITS: No acute abnormality. SINUSES AND MASTOIDS: Nasal airway is present on the right. CTA NECK: AORTIC ARCH AND ARCH VESSELS: Atherosclerotic calcifications are present at the aortic arch and grade and the origin of left subclavian artery to the rather without focal stenosis or aneurysm. Atherosclerotic calcifications are present at the origin of the right subclavian artery. CERVICAL CAROTID ARTERIES: Atherosclerotic calcifications are present at the carotid bifurcations bilaterally without significant stenosis. Mild tortuosity is present in the right common and internal carotid artery without focal stenosis. CERVICAL VERTEBRAL ARTERIES: No dissection, arterial injury, or significant stenosis. LUNGS AND MEDIASTINUM: Unremarkable. SOFT TISSUES: Minimal calcification is present at the right ECA origin without focal stenosis. BONES: No acute abnormality. CTA HEAD: ANTERIOR CIRCULATION: No significant stenosis of the internal carotid arteries. No significant stenosis of the anterior cerebral arteries. No significant stenosis of the middle cerebral arteries. No aneurysm. POSTERIOR CIRCULATION: No significant stenosis of the posterior cerebral arteries. No significant stenosis of the basilar artery. No significant stenosis of the vertebral arteries. No aneurysm. OTHER: No dural venous sinus thrombosis on this non-dedicated study. IMPRESSION: 1. No acute intracranial or vascular abnormality. 2. Atherosclerotic calcifications at the aortic arch, subclavian origins, and carotid bifurcations without significant stenosis. 3. Mild tortuosity of the right common and internal carotid arteries without focal stenosis. Electronically signed by:  Lonni Necessary MD 11/28/2023 07:45 PM EDT RP Workstation: [id number]   CT HEAD CODE STROKE WO CONTRAST (LKW 0-4.5h, LVO 0-24h) Result Date: 11/28/2023 EXAM: CT HEAD WITHOUT CONTRAST 11/28/2023 07:29:18 PM TECHNIQUE: CT of the head was performed without the administration of intravenous contrast. Automated exposure control, iterative reconstruction, and/or weight based adjustment of the mA/kV was utilized to reduce the radiation dose to as low as reasonably achievable. COMPARISON: None available. CLINICAL HISTORY: Neuro deficit, acute, stroke suspected. Code stroke; Seizures; unresponsive, not moving left side; LKW: 1830. FINDINGS: BRAIN AND VENTRICLES: No acute hemorrhage. No evidence of acute infarct. No hydrocephalus. No extra-axial collection. No mass effect or midline shift. [place] stroke program early CT (ASPECT) score: Ganglionic (caudate, IC, lentiform nucleus, insula, M1-M3): 7 Supraganglionic (M4-M6): 3 Total: 10 ORBITS: Bilateral lens replacements are noted. The globes and orbits are otherwise within normal limits. SINUSES: Nasal airway is present. SOFT TISSUES AND SKULL: No acute soft tissue abnormality. No skull fracture. IMPRESSION: 1. No acute intracranial abnormality. The pertinent results were texted to Dr. Michaela via the Amion system at 07:34 pm. Electronically signed by: Lonni Necessary MD 11/28/2023 07:34 PM EDT RP Workstation: [id number]    {  Document cardiac monitor, telemetry assessment procedure when appropriate:32947} .Critical Care  Performed by: Keith Sor, PA-C Authorized by: Keith Sor, PA-C   Critical care provider statement:    Critical care time (minutes):  30   Critical care time was exclusive of:  Separately billable procedures and treating other patients   Critical care was necessary to treat or prevent imminent or life-threatening deterioration of the following conditions:  CNS failure or compromise (status epilepticus)   Critical care was time  spent personally by me on the following activities:  Development of treatment plan with patient or surrogate, discussions with consultants, evaluation of patient's response to treatment, examination of patient, ordering and review of laboratory studies, ordering and review of radiographic studies, ordering and performing treatments and interventions, pulse oximetry, re-evaluation of patient's condition and review of old charts   Care discussed with: admitting provider      Medications Ordered in the ED  valproate (DEPACON) 250 mg in dextrose 5 % 50 mL IVPB (has no administration in time range)    Clinical Course as of 11/29/23 0002  Fri Nov 28, 2023  2357 Soft restraints ordered for safety given degree of agitation. C/w suspected postictal state. [KH]  2358 Dr. Vanessa of Neurology has evaluated patient at bedside. Recommends admission to SDU. Will proceed with continuous EEG monitoring. [KH]  Sat Nov 29, 2023  0001 Nasal trumpet removed as patient protecting airway.  [KH]    Clinical Course User Index [KH] Keith Sor, PA-C   {Click here for ABCD2, HEART and other calculators REFRESH Note before signing:1}                              Medical Decision Making  ***  {Document critical care time when appropriate  Document review of labs and clinical decision tools ie CHADS2VASC2, etc  Document your independent review of radiology images and any outside records  Document your discussion with family members, caretakers and with consultants  Document social determinants of health affecting pt's care  Document your decision making why or why not admission, treatments were needed:32947:::1}   Final diagnoses:  Status epilepticus [facility])    ED Discharge Orders     None

## 2023-11-28 NOTE — Progress Notes (Incomplete)
 Briefly evaluated Frances Phillips at the bedside. She is encephalopathic, eyes closed, agitated, moving all extremities vigorously, spontaneously and antigravity on purpose. Attempting to sit up and get out of the bed. She is moaning and groaning loudly. Careful inspection does not demonstrate any jerking or twitching clinically concerning for seizure.

## 2023-11-28 NOTE — ED Notes (Signed)
 Called pharmacy to get Valproate sent to them. Pharmacy informed this RN that they have to mix it and hand delivery it that they are working on mixing it now.

## 2023-11-28 NOTE — ED Notes (Signed)
 Spoke with Kionna from [facility]; activated Code Stroke

## 2023-11-29 ENCOUNTER — Emergency Department (HOSPITAL_COMMUNITY)

## 2023-11-29 ENCOUNTER — Encounter (HOSPITAL_COMMUNITY)

## 2023-11-29 DIAGNOSIS — N1831 Chronic kidney disease, stage 3a: Secondary | ICD-10-CM | POA: Diagnosis present

## 2023-11-29 DIAGNOSIS — K900 Celiac disease: Secondary | ICD-10-CM | POA: Diagnosis present

## 2023-11-29 DIAGNOSIS — D631 Anemia in chronic kidney disease: Secondary | ICD-10-CM | POA: Diagnosis present

## 2023-11-29 DIAGNOSIS — K219 Gastro-esophageal reflux disease without esophagitis: Secondary | ICD-10-CM | POA: Diagnosis present

## 2023-11-29 DIAGNOSIS — F0390 Unspecified dementia without behavioral disturbance: Secondary | ICD-10-CM | POA: Diagnosis present

## 2023-11-29 DIAGNOSIS — D72829 Elevated white blood cell count, unspecified: Secondary | ICD-10-CM | POA: Diagnosis present

## 2023-11-29 DIAGNOSIS — D649 Anemia, unspecified: Secondary | ICD-10-CM | POA: Diagnosis not present

## 2023-11-29 DIAGNOSIS — Z91018 Allergy to other foods: Secondary | ICD-10-CM | POA: Diagnosis not present

## 2023-11-29 DIAGNOSIS — Z79899 Other long term (current) drug therapy: Secondary | ICD-10-CM | POA: Diagnosis not present

## 2023-11-29 DIAGNOSIS — G629 Polyneuropathy, unspecified: Secondary | ICD-10-CM | POA: Diagnosis present

## 2023-11-29 DIAGNOSIS — R569 Unspecified convulsions: Secondary | ICD-10-CM

## 2023-11-29 DIAGNOSIS — Z885 Allergy status to narcotic agent status: Secondary | ICD-10-CM | POA: Diagnosis not present

## 2023-11-29 DIAGNOSIS — E872 Acidosis, unspecified: Secondary | ICD-10-CM | POA: Diagnosis present

## 2023-11-29 DIAGNOSIS — N183 Chronic kidney disease, stage 3 unspecified: Secondary | ICD-10-CM | POA: Diagnosis present

## 2023-11-29 DIAGNOSIS — Z79890 Hormone replacement therapy (postmenopausal): Secondary | ICD-10-CM | POA: Diagnosis not present

## 2023-11-29 DIAGNOSIS — I129 Hypertensive chronic kidney disease with stage 1 through stage 4 chronic kidney disease, or unspecified chronic kidney disease: Secondary | ICD-10-CM | POA: Diagnosis present

## 2023-11-29 DIAGNOSIS — G9340 Encephalopathy, unspecified: Secondary | ICD-10-CM | POA: Diagnosis not present

## 2023-11-29 DIAGNOSIS — R410 Disorientation, unspecified: Secondary | ICD-10-CM | POA: Diagnosis not present

## 2023-11-29 DIAGNOSIS — Z90710 Acquired absence of both cervix and uterus: Secondary | ICD-10-CM | POA: Diagnosis not present

## 2023-11-29 DIAGNOSIS — F03B2 Unspecified dementia, moderate, with psychotic disturbance: Secondary | ICD-10-CM | POA: Diagnosis present

## 2023-11-29 DIAGNOSIS — G40901 Epilepsy, unspecified, not intractable, with status epilepticus: Secondary | ICD-10-CM | POA: Diagnosis present

## 2023-11-29 DIAGNOSIS — H9190 Unspecified hearing loss, unspecified ear: Secondary | ICD-10-CM | POA: Diagnosis present

## 2023-11-29 DIAGNOSIS — E785 Hyperlipidemia, unspecified: Secondary | ICD-10-CM | POA: Diagnosis present

## 2023-11-29 DIAGNOSIS — E039 Hypothyroidism, unspecified: Secondary | ICD-10-CM | POA: Diagnosis present

## 2023-11-29 DIAGNOSIS — F05 Delirium due to known physiological condition: Secondary | ICD-10-CM | POA: Diagnosis not present

## 2023-11-29 DIAGNOSIS — G40109 Localization-related (focal) (partial) symptomatic epilepsy and epileptic syndromes with simple partial seizures, not intractable, without status epilepticus: Secondary | ICD-10-CM | POA: Diagnosis not present

## 2023-11-29 DIAGNOSIS — R1312 Dysphagia, oropharyngeal phase: Secondary | ICD-10-CM | POA: Diagnosis present

## 2023-11-29 DIAGNOSIS — Z66 Do not resuscitate: Secondary | ICD-10-CM | POA: Diagnosis present

## 2023-11-29 DIAGNOSIS — G40101 Localization-related (focal) (partial) symptomatic epilepsy and epileptic syndromes with simple partial seizures, not intractable, with status epilepticus: Secondary | ICD-10-CM | POA: Diagnosis present

## 2023-11-29 DIAGNOSIS — F03B3 Unspecified dementia, moderate, with mood disturbance: Secondary | ICD-10-CM | POA: Diagnosis present

## 2023-11-29 DIAGNOSIS — Z85828 Personal history of other malignant neoplasm of skin: Secondary | ICD-10-CM | POA: Diagnosis not present

## 2023-11-29 LAB — URINALYSIS, ROUTINE W REFLEX MICROSCOPIC
Bacteria, UA: NONE SEEN
Bilirubin Urine: NEGATIVE
Glucose, UA: NEGATIVE mg/dL
Ketones, ur: NEGATIVE mg/dL
Leukocytes,Ua: NEGATIVE
Nitrite: NEGATIVE
Protein, ur: NEGATIVE mg/dL
Specific Gravity, Urine: 1.018 (ref 1.005–1.030)
pH: 5 (ref 5.0–8.0)

## 2023-11-29 LAB — COMPREHENSIVE METABOLIC PANEL WITH GFR
ALT: 20 U/L (ref 0–44)
AST: 31 U/L (ref 15–41)
Albumin: 3.3 g/dL — ABNORMAL LOW (ref 3.5–5.0)
Alkaline Phosphatase: 60 U/L (ref 38–126)
Anion gap: 13 (ref 5–15)
BUN: 17 mg/dL (ref 8–23)
CO2: 20 mmol/L — ABNORMAL LOW (ref 22–32)
Calcium: 8.1 mg/dL — ABNORMAL LOW (ref 8.9–10.3)
Chloride: 107 mmol/L (ref 98–111)
Creatinine, Ser: 1.17 mg/dL — ABNORMAL HIGH (ref 0.44–1.00)
GFR, Estimated: 47 mL/min — ABNORMAL LOW (ref >60)
Glucose, Bld: 105 mg/dL — ABNORMAL HIGH (ref 70–99)
Potassium: 4.4 mmol/L (ref 3.5–5.1)
Sodium: 140 mmol/L (ref 135–145)
Total Bilirubin: 0.6 mg/dL (ref 0.0–1.2)
Total Protein: 5.6 g/dL — ABNORMAL LOW (ref 6.5–8.1)

## 2023-11-29 LAB — CBC WITH DIFFERENTIAL/PLATELET
Abs Immature Granulocytes: 0.08 K/uL — ABNORMAL HIGH (ref 0.00–0.07)
Basophils Absolute: 0 K/uL (ref 0.0–0.1)
Basophils Relative: 0 %
Eosinophils Absolute: 0 K/uL (ref 0.0–0.5)
Eosinophils Relative: 0 %
HCT: 26.1 % — ABNORMAL LOW (ref 36.0–46.0)
Hemoglobin: 8.8 g/dL — ABNORMAL LOW (ref 12.0–15.0)
Immature Granulocytes: 1 %
Lymphocytes Relative: 13 %
Lymphs Abs: 1.5 K/uL (ref 0.7–4.0)
MCH: 30.4 pg (ref 26.0–34.0)
MCHC: 33.7 g/dL (ref 30.0–36.0)
MCV: 90.3 fL (ref 80.0–100.0)
Monocytes Absolute: 0.3 K/uL (ref 0.1–1.0)
Monocytes Relative: 3 %
Neutro Abs: 9.3 K/uL — ABNORMAL HIGH (ref 1.7–7.7)
Neutrophils Relative %: 83 %
Platelets: 176 K/uL (ref 150–400)
RBC: 2.89 MIL/uL — ABNORMAL LOW (ref 3.87–5.11)
RDW: 15.5 % (ref 11.5–15.5)
WBC: 11.2 K/uL — ABNORMAL HIGH (ref 4.0–10.5)
nRBC: 0 % (ref 0.0–0.2)

## 2023-11-29 LAB — TYPE AND SCREEN
ABO/RH(D): A POS
Antibody Screen: NEGATIVE

## 2023-11-29 LAB — HEMOGLOBIN AND HEMATOCRIT, BLOOD
HCT: 25.3 % — ABNORMAL LOW (ref 36.0–46.0)
Hemoglobin: 8.9 g/dL — ABNORMAL LOW (ref 12.0–15.0)

## 2023-11-29 LAB — ABO/RH: ABO/RH(D): A POS

## 2023-11-29 LAB — TSH: TSH: 2.336 u[IU]/mL (ref 0.350–4.500)

## 2023-11-29 LAB — VALPROIC ACID LEVEL: Valproic Acid Lvl: 69 ug/mL (ref 50–100)

## 2023-11-29 MED ORDER — ACETAMINOPHEN 650 MG RE SUPP
650.0000 mg | Freq: Four times a day (QID) | RECTAL | Status: DC | PRN
  Administered 2023-11-30 (×2): 650 mg via RECTAL
  Filled 2023-11-29 (×2): qty 1

## 2023-11-29 MED ORDER — HALOPERIDOL LACTATE 5 MG/ML IJ SOLN
1.0000 mg | Freq: Four times a day (QID) | INTRAMUSCULAR | Status: DC | PRN
  Administered 2023-11-29 – 2023-12-02 (×6): 1 mg via INTRAVENOUS
  Filled 2023-11-29 (×6): qty 1

## 2023-11-29 MED ORDER — LORAZEPAM 2 MG/ML IJ SOLN
2.0000 mg | INTRAMUSCULAR | Status: DC | PRN
  Administered 2023-11-29 – 2023-12-01 (×5): 2 mg via INTRAVENOUS
  Filled 2023-11-29 (×5): qty 1

## 2023-11-29 MED ORDER — ALBUTEROL SULFATE (2.5 MG/3ML) 0.083% IN NEBU: 2.5000 mg | INHALATION_SOLUTION | Freq: Four times a day (QID) | RESPIRATORY_TRACT | Status: DC | PRN

## 2023-11-29 MED ORDER — SODIUM CHLORIDE 0.9 % IV BOLUS
1000.0000 mL | Freq: Once | INTRAVENOUS | Status: AC
  Administered 2023-11-29: 1000 mL via INTRAVENOUS

## 2023-11-29 MED ORDER — ONDANSETRON HCL 4 MG PO TABS: 4.0000 mg | ORAL_TABLET | Freq: Four times a day (QID) | ORAL | Status: DC | PRN

## 2023-11-29 MED ORDER — SODIUM CHLORIDE 0.9% FLUSH
3.0000 mL | Freq: Two times a day (BID) | INTRAVENOUS | Status: DC
  Administered 2023-11-29 – 2023-12-04 (×10): 3 mL via INTRAVENOUS

## 2023-11-29 MED ORDER — LORAZEPAM 2 MG/ML IJ SOLN
1.0000 mg | Freq: Once | INTRAMUSCULAR | Status: AC
  Administered 2023-11-29: 1 mg via INTRAVENOUS
  Filled 2023-11-29: qty 1

## 2023-11-29 MED ORDER — ONDANSETRON HCL 4 MG/2ML IJ SOLN: 4.0000 mg | Freq: Four times a day (QID) | INTRAMUSCULAR | Status: DC | PRN

## 2023-11-29 MED ORDER — ACETAMINOPHEN 325 MG PO TABS
650.0000 mg | ORAL_TABLET | Freq: Four times a day (QID) | ORAL | Status: DC | PRN
  Administered 2023-12-01: 650 mg via ORAL
  Filled 2023-11-29 (×3): qty 2

## 2023-11-29 MED ORDER — LORAZEPAM 2 MG/ML IJ SOLN
2.0000 mg | Freq: Four times a day (QID) | INTRAMUSCULAR | Status: DC | PRN
  Administered 2023-11-29 (×2): 2 mg via INTRAVENOUS
  Filled 2023-11-29 (×2): qty 1

## 2023-11-29 MED ORDER — PANTOPRAZOLE SODIUM 40 MG IV SOLR
40.0000 mg | INTRAVENOUS | Status: DC
  Administered 2023-11-29 – 2023-12-04 (×6): 40 mg via INTRAVENOUS
  Filled 2023-11-29 (×5): qty 10

## 2023-11-29 MED ORDER — SODIUM CHLORIDE 0.9 % IV SOLN: INTRAVENOUS | Status: DC

## 2023-11-29 NOTE — Progress Notes (Signed)
 NEUROLOGY CONSULT FOLLOW UP NOTE   Date of service: November 29, 2023 Patient Name: Frances Phillips MRN:  [id number] DOB:  September 24, 1940  Interval Hx/subjective  Patient remains hemodynamically stable and afebrile overnight.  Due to agitation, she recently received lorazepam and is quite drowsy on exam but will still localize sternal rub. Vitals   Vitals:   11/29/23 0600 11/29/23 0701 11/29/23 0734 11/29/23 0741  BP: (!) 137/122  (!) 188/149 (!) 151/82  Pulse: 85 85 86 80  Resp: (!) 28 16 (!) 23 19  Temp: 99.2 F (37.3 C) 98.7 F (37.1 C) 98.9 F (37.2 C) 99 F (37.2 C)  TempSrc:      SpO2: 98% 99%       There is no height or weight on file to calculate BMI.  Physical Exam   Constitutional: Well-developed, well-nourished elderly patient in no acute distress Psych: Affect blunted Eyes: No scleral injection.  HENT: No OP obstrucion.  Head: Normocephalic.  Cardiovascular: Normal rate and regular rhythm.  Respiratory: Effort normal, snoring respirations Skin: WDI.   Neurologic Examination    NEURO:  Mental Status: Patient is very drowsy secondary to recent Ativan administration and does not respond to voice or commands Speech/Language: No verbal output  Cranial Nerves:  II: Pupils small but reactive III, IV, VI: Oculocephalic reflex not intact VII: Symmetrical at rest and grimacing XII: Noncooperative with tongue protrusion Motor: Moves all 4 extremities to noxious, antigravity in left upper extremity but does not lift other extremities off the bed Tone: is normal and bulk is normal Sensation- Intact to noxious throughout Coordination: Unable to perform Gait- deferred   Medications  Current Facility-Administered Medications:    acetaminophen (TYLENOL) tablet 650 mg, 650 mg, Oral, Q6H PRN **OR** acetaminophen (TYLENOL) suppository 650 mg, 650 mg, Rectal, Q6H PRN, Smith, Rondell A, MD   albuterol  (PROVENTIL ) (2.5 MG/3ML) 0.083% nebulizer solution 2.5 mg, 2.5 mg,  Nebulization, Q6H PRN, Claudene, Rondell A, MD   LORazepam (ATIVAN) injection 2 mg, 2 mg, Intravenous, Q4H PRN, Smith, Rondell A, MD   ondansetron  (ZOFRAN ) tablet 4 mg, 4 mg, Oral, Q6H PRN **OR** ondansetron  (ZOFRAN ) injection 4 mg, 4 mg, Intravenous, Q6H PRN, Smith, Rondell A, MD   pantoprazole (PROTONIX) injection 40 mg, 40 mg, Intravenous, Q24H, Smith, Rondell A, MD   sodium chloride  flush (NS) 0.9 % injection 3 mL, 3 mL, Intravenous, Q12H, Smith, Rondell A, MD   valproate (DEPACON) 250 mg in dextrose 5 % 50 mL IVPB, 250 mg, Intravenous, Q8H, Khaliqdina, Salman, MD, Stopped at 11/29/23 9341  Current Outpatient Medications:    allopurinol (ZYLOPRIM) 300 MG tablet, Take 300 mg by mouth daily., Disp: , Rfl:    amLODipine-valsartan (EXFORGE) 10-320 MG tablet, Take 1 tablet by mouth daily., Disp: , Rfl:    cyanocobalamin 1000 MCG tablet, Take 1,000 mcg by mouth daily., Disp: , Rfl:    diclofenac Sodium (VOLTAREN) 1 % GEL, diclofenac 1 % topical gel  APPLY TWO GRAMS TO THE AFFECTED AREA(S) FOUR TIMES DAILY, Disp: , Rfl:    levothyroxine (SYNTHROID) 75 MCG tablet, Take 75 mcg by mouth daily before breakfast., Disp: , Rfl:    lisinopril (PRINIVIL,ZESTRIL) 40 MG tablet, Take 40 mg by mouth daily., Disp: , Rfl:    memantine (NAMENDA) 5 MG tablet, Take 5 mg by mouth 2 (two) times daily., Disp: , Rfl:    omeprazole (PRILOSEC) 20 MG capsule, Take 20 mg by mouth daily., Disp: , Rfl:    ondansetron  (ZOFRAN -ODT) 8 MG disintegrating tablet,  Take 1 tablet (8 mg total) by mouth every 8 (eight) hours as needed for nausea or vomiting., Disp: 20 tablet, Rfl: 0   pravastatin (PRAVACHOL) 40 MG tablet, Take 40 mg by mouth every morning., Disp: , Rfl:    valsartan (DIOVAN) 160 MG tablet, Take 160 mg by mouth daily., Disp: , Rfl:    venlafaxine (EFFEXOR) 75 MG tablet, Take 75 mg by mouth daily., Disp: , Rfl:   Labs and Diagnostic Imaging   CBC:  Recent Labs  Lab 11/28/23 1937 11/29/23 0128  WBC 9.7 11.2*   NEUTROABS 7.3 9.3*  HGB 10.3* 8.8*  HCT 30.8* 26.1*  MCV 91.7 90.3  PLT 218 176    Basic Metabolic Panel:  Lab Results  Component Value Date   NA 140 11/29/2023   K 4.4 11/29/2023   CO2 20 (L) 11/29/2023   GLUCOSE 105 (H) 11/29/2023   BUN 17 11/29/2023   CREATININE 1.17 (H) 11/29/2023   CALCIUM 8.1 (L) 11/29/2023   GFRNONAA 47 (L) 11/29/2023   Lipid Panel: No results found for: LDLCALC HgbA1c: No results found for: HGBA1C Urine Drug Screen:     Component Value Date/Time   LABOPIA NONE DETECTED 11/28/2023 1937   COCAINSCRNUR NONE DETECTED 11/28/2023 1937   LABBENZ POSITIVE (A) 11/28/2023 1937   AMPHETMU NONE DETECTED 11/28/2023 1937   THCU NONE DETECTED 11/28/2023 1937   LABBARB NONE DETECTED 11/28/2023 1937    Alcohol Level No results found for: [facility] INR  Lab Results  Component Value Date   INR 1.0 11/28/2023   APTT  Lab Results  Component Value Date   APTT 29 11/28/2023    CT Head without contrast(Personally reviewed): No acute abnormality  CT angio Head and Neck with contrast(Personally reviewed): No LVO or hemodynamically significant stenosis  MRI Brain(Personally reviewed): Subtle cortical restricted diffusion in posterior right temporal and parietal lobes, likely secondary to reported seizures  Continuous EEG:  Pending  Assessment   Frances Phillips is a 83 y.o. female with history of mild cognitive impairment, CKD, hypertension and hyperlipidemia who presents with witnessed seizure activity.  She was noted to be confused and agitated postictally and has been given lorazepam.  She is now quite somnolent but will attempt to localize a sternal rub.  She was initially evaluated for stroke, but MRI demonstrated cortical restricted diffusion in posterior right temporal and parietal lobes consistent with known seizures and no acute infarct.  She has been connected to LTM EEG and loaded with Depakote.  Would continue to observe, and would be cautious about  giving further lorazepam given current somnolence and DNR/DNI status.  Recommendations  -Continue Depakote 250 mg IV every 8 hours - Lorazepam 2 mg IV every 4 hours as needed for further seizure activity, would use judiciously given somnolence - Continue LTM EEG - Seizure precautions - Neurology will continue to follow ______________________________________________________________________ Patient seen by NP and then by MD, MD to edit note as needed.  Signed, Cortney E Everitt Clint Kill, NP [facility]    Attending Neurohospitalist Addendum Patient seen and examined with APP/Resident. Agree with the history and physical as documented above. Agree with the plan as documented, which I helped formulate. I have edited the note above to reflect my full findings and recommendations. I have independently reviewed the chart, obtained history, review of systems and examined the patient.I have personally reviewed pertinent head/neck/spine imaging (CT/MRI). Please feel free to call with any questions.  -- Elida Ross, MD [facility] [phone]  If  7pm- 7am, please page neurology on call as listed in AMION.

## 2023-11-29 NOTE — ED Notes (Signed)
 EEG at bedside.

## 2023-11-29 NOTE — ED Notes (Signed)
 Pt is resting quietly in bed at present. Respirations equal and unlabored. Family remains at bedside

## 2023-11-29 NOTE — H&P (Addendum)
 History and Physical    Patient: Frances Phillips DOB: 1941/01/22 DOA: 11/28/2023 DOS: the patient was seen and examined on 11/29/2023 PCP: Ernestine Juliene BROCKS, MD  Patient coming from: via EMS  Chief Complaint:  Chief Complaint  Patient presents with   Altered Mental Status   HPI: Frances Phillips is a 83 y.o. female with medical history significant of hypertension, hyperlipidemia, CKD 3A, dementia, and hypothyroidism presents with a new onset seizure. She is accompanied by her husband and daughter.  She experienced a seizure during her afternoon nap, characterized by head shaking, arm jerking, followed by rigidity and extension of her arms. She possibly bit her tongue during the episode. She has had multiple episodes of this since last night, with no prior history of similar episodes before that.  Approximately three weeks ago, she fell in the shower, striking the left side of her head, resulting in a lump. She did not seek medical attention at that time.  She has moderate dementia affecting her short-term memory but remains active in activities such as yard work and projects. She also has neuropathy, affecting her balance, causing a sensation of stepping into a hole. She does not use assistive devices for mobility.  Her daughter makes note that the patient had had a low hemoglobin, but no reports of bleeding.  No recent black or dark stools. There is no history of stroke or heart problems. She has a do not resuscitate order and does not wish to be put on a breathing machine.  She has recurrent seizure-like activity witnessed by EMS and received Versed 5 mg IM.  In the emergency department patient was noted to be afebrile, tachypneic, hypertensive with blood pressures elevated up to 192/73, and all other vital signs maintained.  Labs from 10/10 -11 significant for WBC 11.2, hemoglobin 10.3-> 8.8, CO2 17 ->20, creatinine 1.22-> 1.17, AST 42-> 31.  CT scan of the head did not reveal  any acute abnormality.  CT angiogram of the head and neck negative as well for any acute abnormality.  UDS positive for benzodiazepines.  Chest x-ray noted no acute abnormality.  MRI of the brain 6 showed subtle cortical restricted diffusion in the posterior right temporal and parietal lobes thought possibly secondary to reported seizures.  Neurology did place orders for EEG monitoring.  Patient had been given multiple doses of Ativan as well as 1 L normal saline IV fluids.  Later on the in the ED patient become very violent.  Neurology reevaluated again recommended Depacon 3 times daily.   Review of Systems: unable to review all systems due to the inability of the patient to answer questions. Past Medical History:  Diagnosis Date   Basal cell carcinoma 07/24/2015   Left medial canthus. Nodular pattern. Excised 08/15/2015, margins free.   Celiac disease    Hearing loss    Hypertension    Thyroid disease    Past Surgical History:  Procedure Laterality Date   ABDOMINAL HYSTERECTOMY     Social History:  reports that she has never smoked. She has never used smokeless tobacco. She reports that she does not drink alcohol and does not use drugs.  Allergies  Allergen Reactions   Gluten Meal Itching and Nausea And Vomiting    Fissures/sores in back of throat    Percocet [Oxycodone-Acetaminophen] Other (See Comments)    Agitation     History reviewed. No pertinent family history.  Prior to Admission medications   Medication Sig Start Date End Date Taking?  Authorizing Provider  allopurinol (ZYLOPRIM) 300 MG tablet Take 300 mg by mouth daily.   Yes [provider]  amLODipine-valsartan (EXFORGE) 10-320 MG tablet Take 1 tablet by mouth daily. 11/04/22  Yes [provider]  cyanocobalamin 1000 MCG tablet Take 1,000 mcg by mouth daily.   Yes [provider]  diclofenac Sodium (VOLTAREN) 1 % GEL diclofenac 1 % topical gel  APPLY TWO GRAMS TO THE AFFECTED AREA(S) FOUR  TIMES DAILY   Yes [provider]  levothyroxine (SYNTHROID) 75 MCG tablet Take 75 mcg by mouth daily before breakfast.   Yes [provider]  lisinopril (PRINIVIL,ZESTRIL) 40 MG tablet Take 40 mg by mouth daily. 05/23/16  Yes [provider]  memantine (NAMENDA) 5 MG tablet Take 5 mg by mouth 2 (two) times daily. 07/24/22  Yes [provider]  omeprazole (PRILOSEC) 20 MG capsule Take 20 mg by mouth daily.   Yes [provider]  ondansetron  (ZOFRAN -ODT) 8 MG disintegrating tablet Take 1 tablet (8 mg total) by mouth every 8 (eight) hours as needed for nausea or vomiting. 12/31/22  Yes Bernardino Ditch, NP  pravastatin (PRAVACHOL) 40 MG tablet Take 40 mg by mouth every morning. 05/23/16  Yes [provider]  valsartan (DIOVAN) 160 MG tablet Take 160 mg by mouth daily. 09/08/23  Yes [provider]  venlafaxine (EFFEXOR) 75 MG tablet Take 75 mg by mouth daily.   Yes [provider]    Physical Exam: Vitals:   11/29/23 0515 11/29/23 0551 11/29/23 0600 11/29/23 0701  BP:  (!) 184/63 (!) 137/122   Pulse: 84 82 85 85  Resp: (!) 22 (!) 22 (!) 28 16  Temp: 99.1 F (37.3 C) 99.3 F (37.4 C) 99.2 F (37.3 C) 98.7 F (37.1 C)  TempSrc:      SpO2: 99% 98% 98% 99%    Constitutional: Elderly female who is currently lethargic and unable to follow commands Eyes: PERRL, lids and conjunctivae normal ENMT: Mucous membranes are moist.  Concern for possible tongue laceration Neck: normal, supple, no masses, no thyromegaly Respiratory: clear to auscultation bilaterally, no wheezing, no crackles. Normal respiratory effort.   Cardiovascular: Regular rate and rhythm, no murmurs / rubs / gallops. 2+ pedal pulses. No carotid bruits.  Abdomen: no tenderness, no masses palpated. No hepatosplenomegaly. Bowel sounds positive.  Musculoskeletal: no clubbing / cyanosis. No joint deformity upper and lower extremities. Good ROM, no contractures. Normal muscle  tone.  Skin: no rashes, lesions, ulcers. No induration Neurologic: CN 2-12 grossly intact.   Psychiatric: Normal judgment and insight. Alert and oriented x 3. Normal mood.   Data Reviewed:   EKG reveals sinus rhythm at 82 bpm with probable left ventricular hypertrophy.  Reviewed labs, imaging, and pertinent records as documented. Assessment and Plan:  Suspected seizure  Postictal state Patient presents after being noted to have generalized tonic-clonic seizure-like activity as reported by husband.  Subsequent episode witnessed with EMS for the patient had been given had been given 5 mg of Versed IM prior to arrival.  Family notes history of her falling in the shower hitting her head approximately 3 weeks ago for which patient did not seek medical attention.  MRI of the brain did not revealed subtle cortical restricted diffusion in the posterior right and temporal and parietal lobes thought likely secondary to reported seizures. - Admit to progressive bed - Seizure precautions - Neurochecks - N.p.o. and advance diet as tolerated once able to follow commands - Continue Depacon 250 mg  IV every 8 hours - Ativan IV as needed for seizure activity - EEG ordered by neurology - Normal saline IV fluids at 75 mL/h - Appreciate neurology consultative services we will follow-up for any further recommendations.  Leukocytosis Acute.  WBC elevated at 11.2.  Thought to be likely reactive to above. - Check urinalysis - Continue to monitor  Normocytic anemia Acute.  Hemoglobin trended down from 10.3 ->8.8 on recheck this morning.  Patient daughter makes note that her hemoglobin had been low previously - Type and screen for possible need of blood product - Check stool occult - Recheck H&H  Dementia Patient has moderate short-term memory loss as reported by family. - Delirium precautions  Chronic kidney disease stage IIIa Creatinine noted to be 1.22-> 1.17 which appears around baseline. -  Continue to monitor   DVT prophylaxis: SCDs Advance Care Planning:   Code Status: Limited: Do not attempt resuscitation (DNR) -DNR-LIMITED -Do Not Intubate/DNI    Consults: None neurology  Family Communication: Daughter and husband updated at bedside Severity of Illness: The appropriate patient status for this patient is INPATIENT. Inpatient status is judged to be reasonable and necessary in order to provide the required intensity of service to ensure the patient's safety. The patient's presenting symptoms, physical exam findings, and initial radiographic and laboratory data in the context of their chronic comorbidities is felt to place them at high risk for further clinical deterioration. Furthermore, it is not anticipated that the patient will be medically stable for discharge from the hospital within 2 midnights of admission.   * I certify that at the point of admission it is my clinical judgment that the patient will require inpatient hospital care spanning beyond 2 midnights from the point of admission due to high intensity of service, high risk for further deterioration and high frequency of surveillance required.*   Author: Maximino DELENA Sharps, MD 11/29/2023 7:32 AM  For on call review [email or web address].

## 2023-11-29 NOTE — ED Notes (Signed)
 EEG called to notify the EEG machine needed to be turned back on after transfer to another room. EEG tech will hook up as soon as possible.

## 2023-11-29 NOTE — ED Notes (Signed)
 Pt repositioned in the bed, brief placed on pt with disposable pants due to pt trying to pull at her foley catheter. Bed rails are padded due to pt hx of seizures and her agitation and restlessness. Family is at bedside supporting pt

## 2023-11-29 NOTE — Hospital Course (Signed)
  83 year old female past medical history of hypertension, hyperlipidemia, CKD 3A, and hypothyroidism present emergency department with complaining of falling asleep in the lounge chair then started jerking.  Someone tried to wake her up she did not responded.  Her daughter came over and she was not unresponsive so called EMS.  She has recurrent seizure-like activity witnessed by EMS and received Versed 5 mg IM. Initially loaded with Keppra, MRI ruled out a stroke and concern for seizure..  Neurology stated that if no improvement with Keppra load with Depacon and continuous EEG.  Later on the in the ED patient become very violent.  Neurology reevaluated again recommended Depacon 3 times daily.  At presentation to ED patient is hemodynamically stable. CBC unremarkable. CMP showing low bicarb 17, elevated creatinine 1.22 otherwise unremarkable. CBC unremarkable except low hemoglobin and hematocrit 10.3 and 30.8. UDS positive with benzo as patient received Valium. Normal mag level. MRI showed evidence for seizure. CT head no acute evidence of CVA.  CT angio head and neck no acute abnormality.  Chest x-ray unremarkable.  Hospitalist consulted for management of seizure, acute encephalopathy in the setting of seizure, AKI and metabolic acidosis.

## 2023-11-29 NOTE — Progress Notes (Signed)
 LTM EEG hooked up and running - no initial skin breakdown - push button tested - [facility] NOT monitoring while pt in ED.

## 2023-11-29 NOTE — ED Notes (Signed)
 CCMD called.

## 2023-11-29 NOTE — ED Notes (Signed)
 Pt is thrashing around in the bed, severely agitated. Pt is still not oriented to baseline. No PRN agitation or anxiety medicine is available to give at this time. Dr. Sharri notified.

## 2023-11-30 ENCOUNTER — Inpatient Hospital Stay (HOSPITAL_COMMUNITY)

## 2023-11-30 DIAGNOSIS — R569 Unspecified convulsions: Secondary | ICD-10-CM | POA: Diagnosis not present

## 2023-11-30 DIAGNOSIS — G9340 Encephalopathy, unspecified: Secondary | ICD-10-CM | POA: Diagnosis not present

## 2023-11-30 DIAGNOSIS — D649 Anemia, unspecified: Secondary | ICD-10-CM | POA: Diagnosis not present

## 2023-11-30 DIAGNOSIS — N1831 Chronic kidney disease, stage 3a: Secondary | ICD-10-CM | POA: Diagnosis not present

## 2023-11-30 LAB — BASIC METABOLIC PANEL WITH GFR
Anion gap: 11 (ref 5–15)
BUN: 11 mg/dL (ref 8–23)
CO2: 20 mmol/L — ABNORMAL LOW (ref 22–32)
Calcium: 9.1 mg/dL (ref 8.9–10.3)
Chloride: 107 mmol/L (ref 98–111)
Creatinine, Ser: 0.97 mg/dL (ref 0.44–1.00)
GFR, Estimated: 59 mL/min — ABNORMAL LOW (ref >60)
Glucose, Bld: 99 mg/dL (ref 70–99)
Potassium: 3.7 mmol/L (ref 3.5–5.1)
Sodium: 138 mmol/L (ref 135–145)

## 2023-11-30 LAB — CBC
HCT: 28 % — ABNORMAL LOW (ref 36.0–46.0)
Hemoglobin: 10 g/dL — ABNORMAL LOW (ref 12.0–15.0)
MCH: 30.4 pg (ref 26.0–34.0)
MCHC: 35.7 g/dL (ref 30.0–36.0)
MCV: 85.1 fL (ref 80.0–100.0)
Platelets: 190 K/uL (ref 150–400)
RBC: 3.29 MIL/uL — ABNORMAL LOW (ref 3.87–5.11)
RDW: 15.1 % (ref 11.5–15.5)
WBC: 6.5 K/uL (ref 4.0–10.5)
nRBC: 0 % (ref 0.0–0.2)

## 2023-11-30 MED ORDER — HEPARIN SODIUM (PORCINE) 5000 UNIT/ML IJ SOLN
5000.0000 [IU] | Freq: Three times a day (TID) | INTRAMUSCULAR | Status: DC
  Administered 2023-11-30 – 2023-12-04 (×12): 5000 [IU] via SUBCUTANEOUS
  Filled 2023-11-30 (×13): qty 1

## 2023-11-30 MED ORDER — SODIUM CHLORIDE 0.9 % IV SOLN: INTRAVENOUS | Status: AC

## 2023-11-30 NOTE — Procedures (Signed)
 Patient Name: Frances Phillips  MRN: [id number]  Epilepsy Attending: Arlin MALVA Krebs  Referring Physician/Provider: Khaliqdina, Salman, MD  Duration: 11/29/2023 0817 to 11/30/2023 0900  Patient history:  83 y.o. female with history of mild cognitive impairment, CKD, hypertension and hyperlipidemia who presents with witnessed seizure activity. EEG to evaluate for seizure.   Level of alertness: Awake, asleep  AEDs during EEG study: VPA, Ativan    Technical aspects: This EEG study was done with scalp electrodes positioned according to the 10-20 International system of electrode placement. Electrical activity was reviewed with band pass filter of 1-70Hz , sensitivity of 7 uV/mm, display speed of 55mm/sec with a 60Hz  notched filter applied as appropriate. EEG data were recorded continuously and digitally stored.  Video monitoring was available and reviewed as appropriate.  Description: The posterior dominant rhythm consists of 9Hz  activity of moderate voltage (25-35 uV) seen predominantly in posterior head regions, symmetric and reactive to eye opening and eye closing. Sleep was characterized by vertex waves, sleep spindles (12 to 14 Hz), maximal frontocentral region.  There is low amplitude continuous contoured 3 to 6 Hz theta-delta slowing in right temporo-parietal region. Intermittent generalized 3-6hz  theta-delta slowing was also noted. Hyperventilation and photic stimulation were not performed.    EEG was disconnected between 11/29/2023 1818 to 1908 for unclear reason   ABNORMALITY - Continuous slow, right temporo-parietal region - Intermittent slow, generalized  IMPRESSION: This study is suggestive of cortical dysfunction arising from right temporo-parietal region likely secondary to underlying structural abnormality/ post-ictal state. Additionally there is mild generalized cerebral dysfunction/ encephalopathy. No seizures or epileptiform discharges were seen throughout the  recording.  Emalynn Clewis O Jazmen Lindenbaum

## 2023-11-30 NOTE — ED Notes (Signed)
 Floor notified patient coming up

## 2023-11-30 NOTE — Progress Notes (Signed)
 LTM maint complete - no skin breakdown under: F7,F8

## 2023-11-30 NOTE — ED Notes (Signed)
 Pt linen changed, gown changed, repositioned in the bed. Warm blankets given

## 2023-11-30 NOTE — Evaluation (Signed)
 Clinical/Bedside Swallow Evaluation Patient Details  Name: Frances Phillips MRN: [id number] Date of Birth: 29-Dec-1940  Today's Date: 11/30/2023 Time: SLP Start Time (ACUTE ONLY): 1158 SLP Stop Time (ACUTE ONLY): 1219 SLP Time Calculation (min) (ACUTE ONLY): 21 min  Past Medical History:  Past Medical History:  Diagnosis Date   Basal cell carcinoma 07/24/2015   Left medial canthus. Nodular pattern. Excised 08/15/2015, margins free.   Celiac disease    Hearing loss    Hypertension    Thyroid disease    Past Surgical History:  Past Surgical History:  Procedure Laterality Date   ABDOMINAL HYSTERECTOMY     HPI:  83 y.o. female with medical history significant of hypertension, hyperlipidemia, CKD 3A, dementia, and hypothyroidism presented with a new onset seizure. She is accompanied by her husband and daughter.  Patient was hospitalized for further management.  Placed on LTM. CXR negative for acute abnormalities.  MRI remarkable for Subtle cortical restricted diffusion in the posterior right temporal and  parietal lobes, most likely secondary to reported seizures    Assessment / Plan / Recommendation  Clinical Impression  Pt was seen for a clinical swallow evaluation and presents with a suspected cognitive-based dysphagia. Pt was agitated and moaning throughout this assessment.  Granddaughter was present to help try and keep pt as calm as possible.  EEG was running during this evaluation.  Pt was seen with trials of ice chips and thin liquid via tsp and pipette straw.  She accepted all trials into her oral cavity passively and was unable to achieve labial closure around the spoon or straw despite cues.  Prolonged oral transit of boluses was noted, but swallow initiation was observed with all trials and no overt s/sx of aspiration were noted.  Given current agitation, pt is not appropriate for a PO diet at this time, but ST will follow closely to determine readiness for clinical diet iniation vs  instrumental swallow study.  SLP Visit Diagnosis: Dysphagia, unspecified (R13.10)    Aspiration Risk  Moderate aspiration risk    Diet Recommendation NPO         Other  Recommendations Oral Care Recommendations: Oral care QID     Assistance Recommended at Discharge    Functional Status Assessment Patient has had a recent decline in their functional status and demonstrates the ability to make significant improvements in function in a reasonable and predictable amount of time.  Frequency and Duration min 2x/week  2 weeks       Prognosis Prognosis for improved oropharyngeal function: Fair Barriers to Reach Goals: Cognitive deficits      Swallow Study   General Date of Onset: 11/30/23 HPI: 83 y.o. female with medical history significant of hypertension, hyperlipidemia, CKD 3A, dementia, and hypothyroidism presented with a new onset seizure. She is accompanied by her husband and daughter.  Patient was hospitalized for further management.  Placed on LTM. CXR negative for acute abnormalities.  MRI remarkable for Subtle cortical restricted diffusion in the posterior right temporal and  parietal lobes, most likely secondary to reported seizures Type of Study: Bedside Swallow Evaluation Diet Prior to this Study: NPO Temperature Spikes Noted: No Respiratory Status: Room air History of Recent Intubation: No Behavior/Cognition: Agitated;Impulsive Oral Cavity Assessment: Dry Oral Care Completed by SLP: No Oral Cavity - Dentition: Other (Comment) (Unable to assess) Patient Positioning: Upright in bed Baseline Vocal Quality: Low vocal intensity;Breathy Volitional Cough: Cognitively unable to elicit Volitional Swallow: Unable to elicit    Oral/Motor/Sensory Function Overall Oral Motor/Sensory  Function: Other (comment) (unable to follow commands to complete)   Ice Chips Ice chips: Impaired Presentation: Spoon Oral Phase Functional Implications: Prolonged oral transit   Thin Liquid Thin  Liquid: Impaired Presentation: Spoon Oral Phase Functional Implications: Prolonged oral transit    Nectar Thick Nectar Thick Liquid: Not tested   Honey Thick Honey Thick Liquid: Not tested   Puree Puree: Not tested   Solid     Solid: Not tested     Frances Phillips, M.S., CCC-SLP Acute Rehabilitation Services Office: [phone]  Frances SQUIBB Frances Phillips 11/30/2023,1:10 PM

## 2023-11-30 NOTE — ED Notes (Signed)
 Pt tossing in bed. Family at bedside attempting to comfort pt. Pt daughter requested Ativan.

## 2023-11-30 NOTE — Progress Notes (Addendum)
 [facility] PROGRESS NOTE   Frances Phillips FMW:[id number] DOB: [birth year] DOA: 11/28/2023  PCP: Ernestine Juliene BROCKS, MD  Brief History: 83 y.o. female with medical history significant of hypertension, hyperlipidemia, CKD 3A, dementia, and hypothyroidism presented with a new onset seizure. She is accompanied by her husband and daughter.  Patient was hospitalized for further management.  Placed on LTM.   Consultants: Neurology  Procedures: Continuous EEG    Subjective/Interval History: Patient noted to be mildly agitated this morning.  Moving around in the bed.  Her daughter is at the bedside.  She did calm down when her name was called.  However she does not respond to any questions.  Daughter mentions that she is [place] compared to yesterday evening.    Assessment/Plan:  New onset seizure/acute encephalopathy Neurology is following. Patient is on Depakote.  Remains on continuous EEG. Seizure precautions.  Changes secondary to seizure noted on MRI brain.  No significant edema noted. Patient also underwent CT head and CT angiogram head and neck which did not show any acute findings. Continue IV fluids. Labs are pending from today. Currently NPO.  Not safe for oral intake at this time.  SLP evaluation when more awake. Episodes of agitation likely due to seizures.  Currently getting as needed Haldol and Ativan to control her agitation.  Family is at bedside.  Reorient frequently. 3:45PM: Patient reevaluated. Noted to be quite agitated. Multiple family members at bedside. They mention that she has been hallucinating as well. Depakote infusing currently. Haldol PRN is already ordered. Family prefers to wait to see if Depakote helps. Told RN and family that haldol is available if they change their mind. Would also consider restraints as she is a risk of removing EEG leads and telemetry.  Leukocytosis Noted to be afebrile.  Possibly reactive.  Labs are pending from  today.  Normocytic anemia Likely has anemia of chronic disease.  Dilutional drop noted.  No evidence of overt bleeding.  Check anemia panel.  TSH was normal at 2.3.  History of dementia Stable.  Chronic kidney disease stage IIIa Stable.  Labs are pending from today. UA reviewed.  Monitor urine output.  Foley Catheter is noted.  DVT Prophylaxis: Initiate subcutaneous heparin Code Status: DNR Family Communication: Discussed with daughter at bedside Disposition Plan: To be determined  Status is: Inpatient Remains inpatient appropriate because: Seizure activity on continuous EEG      Medications: Scheduled:  pantoprazole (PROTONIX) IV  40 mg Intravenous Q24H   sodium chloride  flush  3 mL Intravenous Q12H   Continuous:  sodium chloride  75 mL/hr at 11/30/23 0612   valproate sodium Stopped (11/30/23 0815)   EMW:jrzujfpwneyzw **OR** acetaminophen, albuterol , haloperidol lactate, LORazepam, ondansetron  **OR** ondansetron  (ZOFRAN ) IV   Objective:  Vital Signs  Vitals:   11/30/23 0400 11/30/23 0500 11/30/23 0603 11/30/23 0811  BP: (!) 158/57 (!) 142/52  (!) 151/119  Pulse: 62 (!) 58  (!) 46  Resp: 20 20  (!) 22  Temp:   98.2 F (36.8 C)   TempSrc:   Axillary   SpO2: 98% 100%  98%    Intake/Output Summary (Last 24 hours) at 11/30/2023 0830 Last data filed at 11/30/2023 0023 Gross per 24 hour  Intake 672.14 ml  Output 2000 ml  Net -1327.86 ml    General appearance: Remains distracted and mildly agitated.  No distress Resp: Clear to auscultation bilaterally.  Normal effort Cardio: S1-S2 is normal regular.  No S3-S4.  No rubs murmurs or bruit GI:  Abdomen is soft.  Nontender nondistended.  Bowel sounds are present normal.  No masses organomegaly Extremities: No edema.  Noted to be moving all 4 extremities Neurologic:  No focal neurological deficits.    Lab Results:  Data Reviewed: I have personally reviewed following labs and reports of the imaging  studies  CBC: Recent Labs  Lab 11/28/23 1937 11/29/23 0128 11/29/23 0840  WBC 9.7 11.2*  --   NEUTROABS 7.3 9.3*  --   HGB 10.3* 8.8* 8.9*  HCT 30.8* 26.1* 25.3*  MCV 91.7 90.3  --   PLT 218 176  --     Basic Metabolic Panel: Recent Labs  Lab 11/28/23 1937 11/29/23 0128  NA 136 140  K 3.9 4.4  CL 104 107  CO2 17* 20*  GLUCOSE 125* 105*  BUN 22 17  CREATININE 1.22* 1.17*  CALCIUM 8.8* 8.1*  MG 1.7  --     GFR: Estimated Creatinine Clearance: 33.3 mL/min (A) (by C-G formula based on SCr of 1.17 mg/dL (H)).  Liver Function Tests: Recent Labs  Lab 11/28/23 1937 11/29/23 0128  AST 42* 31  ALT 23 20  ALKPHOS 71 60  BILITOT 0.8 0.6  PROT 7.1 5.6*  ALBUMIN 4.4 3.3*   Coagulation Profile: Recent Labs  Lab 11/28/23 1937  INR 1.0    CBG: Recent Labs  Lab 11/28/23 1941  GLUCAP 111*   Thyroid Function Tests: Recent Labs    11/29/23 0128  TSH 2.336    Radiology Studies: MR BRAIN WO CONTRAST Result Date: 11/28/2023 EXAM: MR Brain without Intravenous Contrast. CLINICAL HISTORY: Neuro deficit, acute, stroke suspected. Table formatting from the original note was not included.; Neuro deficit, acute, stroke suspected ; CODE STROKE- only code stroke sequences run. TECHNIQUE: Magnetic resonance images of the brain without intravenous contrast in multiple planes. CONTRAST: Without. COMPARISON: Same day CT head. FINDINGS: Limited protocol due to provider request. T2 not performed. BRAIN: Subtle cortical restricted diffusion in the posterior right temporal and parietal lobes. No significant edema. No intracranial mass or hemorrhage. No midline shift or extra-axial fluid collection. The central arterial and venous flow voids are patent. VENTRICLES: No hydrocephalus. ORBITS: The orbits are normal. SINUSES AND MASTOIDS: The sinuses and mastoid air cells are clear. BONES: No acute fracture or focal osseous lesion. IMPRESSION: 1. Subtle cortical restricted diffusion in the  posterior right temporal and parietal lobes, most likely secondary to reported seizures. No significant edema. Appearance and location is atypical for infarct. Electronically signed by: Gilmore Molt MD 11/28/2023 09:02 PM EDT RP Workstation: [id number]   DG Chest Portable 1 View Result Date: 11/28/2023 CLINICAL DATA:  Seizure, hypoxia EXAM: PORTABLE CHEST 1 VIEW COMPARISON:  12/09/2010 FINDINGS: Heart and mediastinal contours are within normal limits. No focal opacities or effusions. No acute bony abnormality. Aortic atherosclerosis. IMPRESSION: No active disease. Electronically Signed   By: Franky Crease M.D.   On: 11/28/2023 20:06   CT ANGIO HEAD NECK W WO CM (CODE STROKE) Result Date: 11/28/2023 EXAM: CT HEAD WITHOUT CTA HEAD AND NECK WITH AND WITHOUT 11/28/2023 07:35:19 PM TECHNIQUE: CTA of the head and neck was performed with and without the administration of intravenous contrast (75 mL iohexol (OMNIPAQUE) 350 MG/ML injection). Noncontrast CT of the head with reconstructed 2-D images are also provided for review. Multiplanar 2D and/or 3D reformatted images are provided for review. Automated exposure control, iterative reconstruction, and/or weight based adjustment of the mA/kV was utilized to reduce the radiation dose to as low as reasonably achievable.  COMPARISON: None available CLINICAL HISTORY: Neuro deficit, acute, stroke suspected. Code stroke; Left side weakness unresponsive; Lkw 1830 FINDINGS: CT HEAD: BRAIN AND VENTRICLES: No acute intracranial hemorrhage. No mass effect. No midline shift. No extra-axial fluid collection. No evidence of acute infarct. No hydrocephalus. ORBITS: No acute abnormality. SINUSES AND MASTOIDS: Nasal airway is present on the right. CTA NECK: AORTIC ARCH AND ARCH VESSELS: Atherosclerotic calcifications are present at the aortic arch and grade and the origin of left subclavian artery to the rather without focal stenosis or aneurysm. Atherosclerotic calcifications are  present at the origin of the right subclavian artery. CERVICAL CAROTID ARTERIES: Atherosclerotic calcifications are present at the carotid bifurcations bilaterally without significant stenosis. Mild tortuosity is present in the right common and internal carotid artery without focal stenosis. CERVICAL VERTEBRAL ARTERIES: No dissection, arterial injury, or significant stenosis. LUNGS AND MEDIASTINUM: Unremarkable. SOFT TISSUES: Minimal calcification is present at the right ECA origin without focal stenosis. BONES: No acute abnormality. CTA HEAD: ANTERIOR CIRCULATION: No significant stenosis of the internal carotid arteries. No significant stenosis of the anterior cerebral arteries. No significant stenosis of the middle cerebral arteries. No aneurysm. POSTERIOR CIRCULATION: No significant stenosis of the posterior cerebral arteries. No significant stenosis of the basilar artery. No significant stenosis of the vertebral arteries. No aneurysm. OTHER: No dural venous sinus thrombosis on this non-dedicated study. IMPRESSION: 1. No acute intracranial or vascular abnormality. 2. Atherosclerotic calcifications at the aortic arch, subclavian origins, and carotid bifurcations without significant stenosis. 3. Mild tortuosity of the right common and internal carotid arteries without focal stenosis. Electronically signed by: Lonni Necessary MD 11/28/2023 07:45 PM EDT RP Workstation: [id number]   CT HEAD CODE STROKE WO CONTRAST (LKW 0-4.5h, LVO 0-24h) Result Date: 11/28/2023 EXAM: CT HEAD WITHOUT CONTRAST 11/28/2023 07:29:18 PM TECHNIQUE: CT of the head was performed without the administration of intravenous contrast. Automated exposure control, iterative reconstruction, and/or weight based adjustment of the mA/kV was utilized to reduce the radiation dose to as low as reasonably achievable. COMPARISON: None available. CLINICAL HISTORY: Neuro deficit, acute, stroke suspected. Code stroke; Seizures; unresponsive, not moving  left side; LKW: 1830. FINDINGS: BRAIN AND VENTRICLES: No acute hemorrhage. No evidence of acute infarct. No hydrocephalus. No extra-axial collection. No mass effect or midline shift. [place] stroke program early CT (ASPECT) score: Ganglionic (caudate, IC, lentiform nucleus, insula, M1-M3): 7 Supraganglionic (M4-M6): 3 Total: 10 ORBITS: Bilateral lens replacements are noted. The globes and orbits are otherwise within normal limits. SINUSES: Nasal airway is present. SOFT TISSUES AND SKULL: No acute soft tissue abnormality. No skull fracture. IMPRESSION: 1. No acute intracranial abnormality. The pertinent results were texted to Dr. Michaela via the Amion system at 07:34 pm. Electronically signed by: Lonni Necessary MD 11/28/2023 07:34 PM EDT RP Workstation: [id number]       LOS: 1 day   Joette Pebbles  [facility] Pager on [email or web address]  11/30/2023, 8:30 AM

## 2023-11-30 NOTE — Progress Notes (Signed)
 vLTM maintenance  All impedances below 10k  Moved patient to 5W10  Beinng monitored by [facility] now

## 2023-11-30 NOTE — ED Notes (Signed)
 Pt restless in the bed, pt crying and anxious.

## 2023-11-30 NOTE — ED Notes (Signed)
 Pt pulled BP cuff off and removed blankets.

## 2023-11-30 NOTE — ED Notes (Signed)
 3 attempts at bloodwork on pt. Will attempt with another phlebotomist when shift change occurs

## 2023-11-30 NOTE — ED Notes (Signed)
 RN gave courtesy call IP floor

## 2023-11-30 NOTE — ED Notes (Signed)
 Pt tossing and turning in bed. Pulling at gown and monitoring cords. Pt daughter requested PRN Ativan

## 2023-11-30 NOTE — Progress Notes (Signed)
 NEUROLOGY CONSULT FOLLOW UP NOTE   Date of service: November 30, 2023 Patient Name: Frances Phillips MRN:  [id number] DOB:  Mar 30, 1940  Interval Hx/subjective  Patient remains hemodynamically stable and afebrile overnight.  Daughter at the bedside states that 10/11 they had a normal day.  Patient and her husband picked up some years and went to the daughter's house to help hide wallpaper.  She went home to take a nap and this seizure episode started while she was sleeping in the chair.  She ate and drink normally, was not outside gardening yesterday, however this is something she does normally.  Patient is typically independent, manages all ADLs on her own.  Vitals   Vitals:   11/30/23 0400 11/30/23 0500 11/30/23 0603 11/30/23 0811  BP: (!) 158/57 (!) 142/52  (!) 151/119  Pulse: 62 (!) 58  (!) 46  Resp: 20 20  (!) 22  Temp:   98.2 F (36.8 C)   TempSrc:   Axillary   SpO2: 98% 100%  98%     There is no height or weight on file to calculate BMI.  Physical Exam   Constitutional: Well-developed, well-nourished elderly patient in no acute distress Psych: Affect blunted Eyes: No scleral injection.  HENT: No OP obstrucion.  Head: Normocephalic.  Cardiovascular: Normal rate and regular rhythm.  Respiratory: Effort normal, snoring respirations Skin: WDI.   Neurologic Examination    NEURO:  Mental Status: Restless, no verbal output. Intermittently nods appropriately per family but did not for me.  Speech/Language: No verbal output  Cranial Nerves:  II: Pupils small but reactive III, IV, VI: Oculocephalic reflex intact VII: Symmetrical at rest and grimacing XII: Noncooperative with tongue protrusion Motor: Moves all 4 extremities spontaneously, does not follow commands Able to maneuver herself on the stretcher without assistance Tone: is normal and bulk is normal Sensation- Intact to noxious throughout Coordination: Unable to perform, but no ataxia noted with spontaneous  movements Gait- deferred   Medications  Current Facility-Administered Medications:    0.9 %  sodium chloride  infusion, , Intravenous, Continuous, Claudene Reeves A, MD, Last Rate: 75 mL/hr at 11/30/23 0612, New Bag at 11/30/23 9387   acetaminophen (TYLENOL) tablet 650 mg, 650 mg, Oral, Q6H PRN **OR** acetaminophen (TYLENOL) suppository 650 mg, 650 mg, Rectal, Q6H PRN, Claudene, Rondell A, MD   albuterol  (PROVENTIL ) (2.5 MG/3ML) 0.083% nebulizer solution 2.5 mg, 2.5 mg, Nebulization, Q6H PRN, Claudene, Rondell A, MD   haloperidol lactate (HALDOL) injection 1 mg, 1 mg, Intravenous, Q6H PRN, Segars, Jonathan, MD, 1 mg at 11/29/23 2102   LORazepam (ATIVAN) injection 2 mg, 2 mg, Intravenous, Q4H PRN, Claudene, Rondell A, MD, 2 mg at 11/30/23 0024   ondansetron  (ZOFRAN ) tablet 4 mg, 4 mg, Oral, Q6H PRN **OR** ondansetron  (ZOFRAN ) injection 4 mg, 4 mg, Intravenous, Q6H PRN, Smith, Rondell A, MD   pantoprazole (PROTONIX) injection 40 mg, 40 mg, Intravenous, Q24H, Smith, Rondell A, MD, 40 mg at 11/30/23 0815   sodium chloride  flush (NS) 0.9 % injection 3 mL, 3 mL, Intravenous, Q12H, Smith, Rondell A, MD, 3 mL at 11/29/23 2113   valproate (DEPACON) 250 mg in dextrose 5 % 50 mL IVPB, 250 mg, Intravenous, Q8H, Khaliqdina, Salman, MD, Stopped at 11/30/23 0815  Current Outpatient Medications:    allopurinol (ZYLOPRIM) 300 MG tablet, Take 300 mg by mouth daily., Disp: , Rfl:    amLODipine-valsartan (EXFORGE) 10-320 MG tablet, Take 1 tablet by mouth daily., Disp: , Rfl:    cyanocobalamin 1000 MCG tablet,  Take 1,000 mcg by mouth daily., Disp: , Rfl:    diclofenac Sodium (VOLTAREN) 1 % GEL, diclofenac 1 % topical gel  APPLY TWO GRAMS TO THE AFFECTED AREA(S) FOUR TIMES DAILY, Disp: , Rfl:    levothyroxine (SYNTHROID) 75 MCG tablet, Take 75 mcg by mouth daily before breakfast., Disp: , Rfl:    lisinopril (PRINIVIL,ZESTRIL) 40 MG tablet, Take 40 mg by mouth daily., Disp: , Rfl:    memantine (NAMENDA) 5 MG tablet, Take 5 mg  by mouth 2 (two) times daily., Disp: , Rfl:    omeprazole (PRILOSEC) 20 MG capsule, Take 20 mg by mouth daily., Disp: , Rfl:    ondansetron  (ZOFRAN -ODT) 8 MG disintegrating tablet, Take 1 tablet (8 mg total) by mouth every 8 (eight) hours as needed for nausea or vomiting., Disp: 20 tablet, Rfl: 0   pravastatin (PRAVACHOL) 40 MG tablet, Take 40 mg by mouth every morning., Disp: , Rfl:    valsartan (DIOVAN) 160 MG tablet, Take 160 mg by mouth daily., Disp: , Rfl:    venlafaxine (EFFEXOR) 75 MG tablet, Take 75 mg by mouth daily., Disp: , Rfl:   Labs and Diagnostic Imaging   CBC:  Recent Labs  Lab 11/28/23 1937 11/29/23 0128 11/29/23 0840  WBC 9.7 11.2*  --   NEUTROABS 7.3 9.3*  --   HGB 10.3* 8.8* 8.9*  HCT 30.8* 26.1* 25.3*  MCV 91.7 90.3  --   PLT 218 176  --     Basic Metabolic Panel:  Lab Results  Component Value Date   NA 140 11/29/2023   K 4.4 11/29/2023   CO2 20 (L) 11/29/2023   GLUCOSE 105 (H) 11/29/2023   BUN 17 11/29/2023   CREATININE 1.17 (H) 11/29/2023   CALCIUM 8.1 (L) 11/29/2023   GFRNONAA 47 (L) 11/29/2023   Lipid Panel: No results found for: LDLCALC HgbA1c: No results found for: HGBA1C Urine Drug Screen:     Component Value Date/Time   LABOPIA NONE DETECTED 11/28/2023 1937   COCAINSCRNUR NONE DETECTED 11/28/2023 1937   LABBENZ POSITIVE (A) 11/28/2023 1937   AMPHETMU NONE DETECTED 11/28/2023 1937   THCU NONE DETECTED 11/28/2023 1937   LABBARB NONE DETECTED 11/28/2023 1937    Alcohol Level No results found for: [facility] INR  Lab Results  Component Value Date   INR 1.0 11/28/2023   APTT  Lab Results  Component Value Date   APTT 29 11/28/2023    CT Head without contrast(Personally reviewed): No acute abnormality  CT angio Head and Neck with contrast(Personally reviewed): No LVO or hemodynamically significant stenosis  MRI Brain(Personally reviewed): Subtle cortical restricted diffusion in posterior right temporal and parietal lobes, likely  secondary to reported seizures  Continuous EEG: 11/29/2023 0817 to 11/30/2023 0900  ABNORMALITY - Continuous slow, right temporo-parietal region - Intermittent slow, generalized IMPRESSION: This study is suggestive of cortical dysfunction arising from right temporo-parietal region likely secondary to underlying structural abnormality/ post-ictal state. Additionally there is mild generalized cerebral dysfunction/ encephalopathy. No seizures or epileptiform discharges were seen throughout the recording.  Assessment   TREY BEBEE is a 83 y.o. female with history of mild cognitive impairment, CKD, hypertension and hyperlipidemia who presents with witnessed seizure activity.  She was initially evaluated for stroke, but MRI demonstrated cortical restricted diffusion in posterior right temporal and parietal lobes consistent with known seizures and no acute infarct.  She is connected to LTM EEG and was loaded with Depakote 2.6g on 10/10.  She has received one dose of haldol  and two doses of ativan. She appears restless, however family says this has slightly improved and she is not agitated like she was prior. Family states haldol did seem to help as well.  Recommendations  - Continue Depakote 250 mg IV every 8 hours - Lorazepam 2 mg IV every 4 hours as needed for further seizure activity, would use judiciously given somnolence - Continue LTM EEG - Seizure precautions - Neurology will continue to follow ______________________________________________________________________   Patient seen and examined by NP/APP with MD. MD to update note as needed.   Jorene Last, DNP, FNP-BC [facility] Pager: [phone]   Attending Neurohospitalist Addendum Patient seen and examined with APP/Resident. Agree with the history and physical as documented above. Agree with the plan as documented, which I helped formulate. I have edited the note above to reflect my full findings and  recommendations. I have independently reviewed the chart, obtained history, review of systems and examined the patient.I have personally reviewed pertinent head/neck/spine imaging (CT/MRI). Please feel free to call with any questions.  -- Elida Ross, MD [facility] [phone]  If 7pm- 7am, please page neurology on call as listed in AMION.

## 2023-12-01 ENCOUNTER — Inpatient Hospital Stay (HOSPITAL_COMMUNITY)

## 2023-12-01 DIAGNOSIS — G40109 Localization-related (focal) (partial) symptomatic epilepsy and epileptic syndromes with simple partial seizures, not intractable, without status epilepticus: Secondary | ICD-10-CM

## 2023-12-01 DIAGNOSIS — R569 Unspecified convulsions: Secondary | ICD-10-CM | POA: Diagnosis not present

## 2023-12-01 DIAGNOSIS — D72829 Elevated white blood cell count, unspecified: Secondary | ICD-10-CM | POA: Diagnosis not present

## 2023-12-01 DIAGNOSIS — N1831 Chronic kidney disease, stage 3a: Secondary | ICD-10-CM | POA: Diagnosis not present

## 2023-12-01 DIAGNOSIS — R410 Disorientation, unspecified: Secondary | ICD-10-CM

## 2023-12-01 DIAGNOSIS — F0390 Unspecified dementia without behavioral disturbance: Secondary | ICD-10-CM | POA: Diagnosis not present

## 2023-12-01 LAB — RETICULOCYTES
Immature Retic Fract: 9 % (ref 2.3–15.9)
RBC.: 3.31 MIL/uL — ABNORMAL LOW (ref 3.87–5.11)
Retic Count, Absolute: 250.9 K/uL — ABNORMAL HIGH (ref 19.0–186.0)
Retic Ct Pct: 7.6 % — ABNORMAL HIGH (ref 0.4–3.1)

## 2023-12-01 LAB — CBC
HCT: 28.9 % — ABNORMAL LOW (ref 36.0–46.0)
Hemoglobin: 10.1 g/dL — ABNORMAL LOW (ref 12.0–15.0)
MCH: 30 pg (ref 26.0–34.0)
MCHC: 34.9 g/dL (ref 30.0–36.0)
MCV: 85.8 fL (ref 80.0–100.0)
Platelets: 201 K/uL (ref 150–400)
RBC: 3.37 MIL/uL — ABNORMAL LOW (ref 3.87–5.11)
RDW: 15.1 % (ref 11.5–15.5)
WBC: 7.1 K/uL (ref 4.0–10.5)
nRBC: 0 % (ref 0.0–0.2)

## 2023-12-01 LAB — VITAMIN B12: Vitamin B-12: 697 pg/mL (ref 180–914)

## 2023-12-01 LAB — COMPREHENSIVE METABOLIC PANEL WITH GFR
ALT: 22 U/L (ref 0–44)
AST: 33 U/L (ref 15–41)
Albumin: 3.8 g/dL (ref 3.5–5.0)
Alkaline Phosphatase: 67 U/L (ref 38–126)
Anion gap: 11 (ref 5–15)
BUN: 15 mg/dL (ref 8–23)
CO2: 22 mmol/L (ref 22–32)
Calcium: 9.6 mg/dL (ref 8.9–10.3)
Chloride: 107 mmol/L (ref 98–111)
Creatinine, Ser: 1.1 mg/dL — ABNORMAL HIGH (ref 0.44–1.00)
GFR, Estimated: 51 mL/min — ABNORMAL LOW (ref >60)
Glucose, Bld: 86 mg/dL (ref 70–99)
Potassium: 3.6 mmol/L (ref 3.5–5.1)
Sodium: 140 mmol/L (ref 135–145)
Total Bilirubin: 1.1 mg/dL (ref 0.0–1.2)
Total Protein: 6.8 g/dL (ref 6.5–8.1)

## 2023-12-01 LAB — FERRITIN: Ferritin: 108 ng/mL (ref 11–307)

## 2023-12-01 LAB — MAGNESIUM: Magnesium: 1.7 mg/dL (ref 1.7–2.4)

## 2023-12-01 LAB — IRON AND TIBC
Iron: 62 ug/dL (ref 28–170)
Saturation Ratios: 19 % (ref 10.4–31.8)
TIBC: 336 ug/dL (ref 250–450)
UIBC: 274 ug/dL

## 2023-12-01 LAB — FOLATE: Folate: 18.2 ng/mL (ref >5.9)

## 2023-12-01 MED ORDER — DIVALPROEX SODIUM 250 MG PO DR TAB
250.0000 mg | DELAYED_RELEASE_TABLET | Freq: Every morning | ORAL | Status: DC
  Administered 2023-12-02 – 2023-12-04 (×3): 250 mg via ORAL
  Filled 2023-12-01 (×3): qty 1

## 2023-12-01 MED ORDER — AMLODIPINE BESYLATE 5 MG PO TABS
5.0000 mg | ORAL_TABLET | Freq: Every day | ORAL | Status: DC
  Administered 2023-12-01 – 2023-12-02 (×2): 5 mg via ORAL
  Filled 2023-12-01 (×2): qty 1

## 2023-12-01 MED ORDER — QUETIAPINE FUMARATE 25 MG PO TABS
12.5000 mg | ORAL_TABLET | Freq: Every day | ORAL | Status: DC
  Administered 2023-12-01: 12.5 mg via ORAL
  Filled 2023-12-01: qty 1

## 2023-12-01 MED ORDER — DIVALPROEX SODIUM 500 MG PO DR TAB
500.0000 mg | DELAYED_RELEASE_TABLET | Freq: Every day | ORAL | Status: DC
Start: 2023-12-02 — End: 2023-12-04
  Administered 2023-12-02 – 2023-12-03 (×2): 500 mg via ORAL
  Filled 2023-12-01 (×3): qty 1

## 2023-12-01 MED ORDER — HYDRALAZINE HCL 20 MG/ML IJ SOLN
10.0000 mg | Freq: Four times a day (QID) | INTRAMUSCULAR | Status: DC | PRN
  Administered 2023-12-01 – 2023-12-02 (×2): 10 mg via INTRAVENOUS
  Filled 2023-12-01 (×2): qty 1

## 2023-12-01 MED ORDER — DIVALPROEX SODIUM 250 MG PO DR TAB
250.0000 mg | DELAYED_RELEASE_TABLET | Freq: Once | ORAL | Status: AC
  Administered 2023-12-01: 250 mg via ORAL
  Filled 2023-12-01: qty 1

## 2023-12-01 MED ORDER — IRBESARTAN 300 MG PO TABS
150.0000 mg | ORAL_TABLET | Freq: Every day | ORAL | Status: DC
  Administered 2023-12-01 – 2023-12-03 (×3): 150 mg via ORAL
  Filled 2023-12-01 (×3): qty 1

## 2023-12-01 MED ORDER — CHLORHEXIDINE GLUCONATE CLOTH 2 % EX PADS
6.0000 | MEDICATED_PAD | Freq: Every day | CUTANEOUS | Status: DC
  Administered 2023-12-02 – 2023-12-04 (×3): 6 via TOPICAL

## 2023-12-01 NOTE — Plan of Care (Signed)

## 2023-12-01 NOTE — Procedures (Signed)
 Patient Name: Frances Phillips  MRN: [id number]  Epilepsy Attending: Arlin MALVA Krebs  Referring Physician/Provider: Khaliqdina, Salman, MD  Duration: 12/01/2023 0900 to 12/01/2023 1259   Patient history:  83 y.o. female with history of mild cognitive impairment, CKD, hypertension and hyperlipidemia who presents with witnessed seizure activity. EEG to evaluate for seizure.    Level of alertness: Awake, asleep   AEDs during EEG study: VPA     Technical aspects: This EEG study was done with scalp electrodes positioned according to the 10-20 International system of electrode placement. Electrical activity was reviewed with band pass filter of 1-70Hz , sensitivity of 7 uV/mm, display speed of 78mm/sec with a 60Hz  notched filter applied as appropriate. EEG data were recorded continuously and digitally stored.  Video monitoring was available and reviewed as appropriate.   Description: The posterior dominant rhythm consists of 9Hz  activity of moderate voltage (25-35 uV) seen predominantly in posterior head regions, symmetric and reactive to eye opening and eye closing. Sleep was characterized by vertex waves, sleep spindles (12 to 14 Hz), maximal frontocentral region.  There is intermittent 3 to 6 Hz theta-delta slowing in right temporo-parietal region. Intermittent generalized 3-6hz  theta-delta slowing was also noted. Hyperventilation and photic stimulation were not performed.     ABNORMALITY -  Intermittent slow, right temporo-parietal region   IMPRESSION: This study is suggestive of cortical dysfunction arising from right temporo-parietal region likely secondary to underlying structural abnormality/ post-ictal state. No seizures or epileptiform discharges were seen throughout the recording.   Aleksa Collinsworth O Britany Callicott

## 2023-12-01 NOTE — Evaluation (Signed)
 Occupational Therapy Evaluation [name] MRN: [id number] DOB: [birth year] Today's Date: 12/01/2023   History of Present Illness   Frances Phillips is a 83 y.o. female presents with a new onset seizure. PMH: hypertension, hyperlipidemia, CKD 3A, dementia, and hypothyroidism Dtr reports falling in shower approx 3 weeks ago hitting L side of head resulting in a lump but didn't seek medical attn.     Clinical Impressions Patient admitted for the diagnosis above.  PTA she lived alone, and remained fairly independent with ADL,iADL and mobility.  Daughter stating the patient does drive locally.  Patient presents with the deficits listed, currently needing +2 for basic mobility, and up to Max A for lower body ADL.  Given prior level of function, patient should respond well to rehab and demonstrates the ability to regain independence.  Patient will benefit from intensive inpatient follow-up therapy, >3 hours/day.     If plan is discharge home, recommend the following:   A lot of help with walking and/or transfers;A lot of help with bathing/dressing/bathroom;Assist for transportation;Assistance with cooking/housework     Functional Status Assessment   Patient has had a recent decline in their functional status and demonstrates the ability to make significant improvements in function in a reasonable and predictable amount of time.     Equipment Recommendations   None recommended by OT     Recommendations for Other Services         Precautions/Restrictions   Precautions Precautions: Fall Recall of Precautions/Restrictions: Impaired Precaution/Restrictions Comments: pt on continuous EEG Restrictions Weight Bearing Restrictions Per Provider Order: No     Mobility Bed Mobility Overal bed mobility: Needs Assistance       Supine to sit: Mod assist          Transfers Overall transfer level: Needs assistance Equipment used: 2 person hand held  assist Transfers: Sit to/from Stand, Bed to chair/wheelchair/BSC Sit to Stand: Mod assist, +2 physical assistance     Step pivot transfers: Mod assist, +2 physical assistance            Balance Overall balance assessment: Needs assistance Sitting-balance support: Feet supported, Bilateral upper extremity supported Sitting balance-Leahy Scale: Zero     Standing balance support: Bilateral upper extremity supported, During functional activity Standing balance-Leahy Scale: Zero                             ADL either performed or assessed with clinical judgement   ADL Overall ADL's : Needs assistance/impaired Eating/Feeding: Minimal assistance;Sitting   Grooming: Minimal assistance;Sitting   Upper Body Bathing: Moderate assistance;Sitting   Lower Body Bathing: Maximal assistance;Bed level   Upper Body Dressing : Moderate assistance;Maximal assistance;Sitting   Lower Body Dressing: Maximal assistance;Bed level   Toilet Transfer: Moderate assistance;+2 for physical assistance                   Vision   Vision Assessment?: No apparent visual deficits     Perception Perception: Not tested       Praxis Praxis: Not tested       Pertinent Vitals/Pain Pain Assessment Pain Assessment: Faces Faces Pain Scale: Hurts a little bit Pain Location: restless in the bed, back Pain Descriptors / Indicators: Discomfort Pain Intervention(s): Monitored during session     Extremity/Trunk Assessment Upper Extremity Assessment Upper Extremity Assessment: Generalized weakness   Lower Extremity Assessment Lower Extremity Assessment: Defer to PT evaluation RLE Deficits / Details: impaired  coordination, generalized weakness LLE Deficits / Details: impaired coordination, generalized weakness   Cervical / Trunk Assessment Cervical / Trunk Assessment: Kyphotic   Communication Communication Communication: Impaired Factors Affecting Communication: Reduced clarity  of speech   Cognition Arousal: Alert Behavior During Therapy: Restless Cognition: Cognition impaired   Orientation impairments: Place, Time, Situation Awareness: Intellectual awareness impaired, Online awareness impaired Memory impairment (select all impairments): Short-term memory, Working memory Attention impairment (select first level of impairment): Focused attention Executive functioning impairment (select all impairments): Problem solving, Initiation                   Following commands: Impaired Following commands impaired: Follows one step commands inconsistently, Follows one step commands with increased time     Cueing  General Comments   Cueing Techniques: Verbal cues;Gestural cues;Tactile cues  continuous EEG remains intact. BP elevated at 198/82   Exercises     Shoulder Instructions      Home Living Family/patient expects to be discharged to:: Private residence Living Arrangements: Spouse/significant other Available Help at Discharge: Family;Available 24 hours/day Type of Home: House Home Access: Ramped entrance     Home Layout: One level     Bathroom Shower/Tub: Producer, television/film/video: Handicapped height Bathroom Accessibility: Yes   Home Equipment: Agricultural consultant (2 wheels);Rollator (4 wheels);Shower seat;BSC/3in1;Grab bars - tub/shower;Wheelchair - manual          Prior Functioning/Environment Prior Level of Function : Independent/Modified Independent;Driving             Mobility Comments: 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, yard work, tends to garden, Lexicographer, [employer], no AD ADLs Comments: indep with exception of cooking    OT Problem List: Decreased strength;Decreased activity tolerance;Impaired balance (sitting and/or standing);Decreased safety awareness;Decreased cognition   OT Treatment/Interventions: Self-care/ADL training;Therapeutic activities;Patient/family education;Balance training;DME and/or AE instruction      OT  Goals(Current goals can be found in the care plan section)   Acute Rehab OT Goals Patient Stated Goal: Whatever rehab she needs OT Goal Formulation: With family Time For Goal Achievement: 12/15/23 Potential to Achieve Goals: Good ADL Goals Pt Will Perform Grooming: with supervision;sitting Pt Will Perform Upper Body Bathing: with supervision;sitting Pt Will Perform Lower Body Bathing: with min assist;sit to/from stand Pt Will Perform Upper Body Dressing: with supervision;sitting Pt Will Perform Lower Body Dressing: with min assist;sit to/from stand Pt Will Transfer to Toilet: with min assist;bedside commode;stand pivot transfer   OT Frequency:  Min 2X/week    Co-evaluation PT/OT/SLP Co-Evaluation/Treatment: Yes Reason for Co-Treatment: Complexity of the patient's impairments (multi-system involvement) PT goals addressed during session: Mobility/safety with mobility OT goals addressed during session: ADL's and self-care      AM-PAC OT 6 Clicks Daily Activity     Outcome Measure Help from another person eating meals?: A Little Help from another person taking care of personal grooming?: A Lot Help from another person toileting, which includes using toliet, bedpan, or urinal?: A Lot Help from another person bathing (including washing, rinsing, drying)?: A Lot Help from another person to put on and taking off regular upper body clothing?: A Lot Help from another person to put on and taking off regular lower body clothing?: A Lot 6 Click Score: 13   End of Session Equipment Utilized During Treatment: Gait belt Nurse Communication: Mobility status  Activity Tolerance: Patient tolerated treatment well Patient left: in chair;with call bell/phone within reach;with chair alarm set;with family/visitor present  OT Visit Diagnosis: Unsteadiness on feet (R26.81);Muscle weakness (generalized) (M62.81);History  of falling (Z91.81);Other symptoms and signs involving cognitive function                 Time: 9176-9149 OT Time Calculation (min): 27 min Charges:  OT General Charges $OT Visit: 1 Visit OT Evaluation $OT Eval Moderate Complexity: 1 Mod  12/01/2023  RP, OTR/L  Acute Rehabilitation Services  Office:  [phone]   Frances Phillips 12/01/2023, 9:42 AM

## 2023-12-01 NOTE — Progress Notes (Signed)
 LTM EEG disconnected - no skin breakdown at [facility]. [facility] notified.

## 2023-12-01 NOTE — Progress Notes (Signed)
 Subjective: No further seizures overnight.  Husband, daughter and granddaughter at bedside states she has improved but still just mumbling rather than speaking clearly.  Also reports she was hallucinating last night.  ROS: Unable to obtain due to poor mental status  Examination  Vital signs in last 24 hours: Temp:  [97.6 F (36.4 C)-97.8 F (36.6 C)] 97.8 F (36.6 C) (10/13 0800) Pulse Rate:  [65-72] 70 (10/13 0800) Resp:  [18] 18 (10/13 0603) BP: (164-189)/(57-61) 164/57 (10/13 0800)  General: Sitting in chair, looks restless Neuro: Awake, alert, able to tell me her name and recognize other family members, did not know where she is, could not answer the month, does follow simple commands but needs repetition, cranial nerves appear grossly intact, 4/5 strength in all 4 extremities  Basic Metabolic Panel: Recent Labs  Lab 11/28/23 1937 11/29/23 0128 11/30/23 1244 12/01/23 0355  NA 136 140 138 140  K 3.9 4.4 3.7 3.6  CL 104 107 107 107  CO2 17* 20* 20* 22  GLUCOSE 125* 105* 99 86  BUN 22 17 11 15   CREATININE 1.22* 1.17* 0.97 1.10*  CALCIUM 8.8* 8.1* 9.1 9.6  MG 1.7  --   --  1.7    CBC: Recent Labs  Lab 11/28/23 1937 11/29/23 0128 11/29/23 0840 11/30/23 1244 12/01/23 0355  WBC 9.7 11.2*  --  6.5 7.1  NEUTROABS 7.3 9.3*  --   --   --   HGB 10.3* 8.8* 8.9* 10.0* 10.1*  HCT 30.8* 26.1* 25.3* 28.0* 28.9*  MCV 91.7 90.3  --  85.1 85.8  PLT 218 176  --  190 201     Coagulation Studies: Recent Labs    11/28/23 1937  LABPROT 13.7  INR 1.0    Imaging personally reviewed  MRI brain without contrast 11/28/2023: Subtle cortical restricted diffusion in the posterior right temporal and parietal lobes, most likely secondary to reported seizures. No significant edema. Appearance and location is atypical for infarct.   CT head and neck without contrast 11/28/2023: 1. No acute intracranial or vascular abnormality. Atherosclerotic calcifications at the aortic arch,  subclavian origins, and carotid bifurcations without significant stenosis. Mild tortuosity of the right common and internal carotid arteries without focal stenosis.  CT head without contrast 11/28/2023:No acute intracranial abnormality.    ASSESSMENT AND PLAN: 83 yo F presented on 11/28/2023 with new onset focal status epilepticus with persistent left gaze and left sided weakness.   Status epilepticus, resolved Encephalopathy, likely due to delirium  - No clear etiology of seizures.  Patient does have dementia - Recommend Depakote 250 mg every morning and 500 mg nightly - Recommend scheduling Seroquel 12.5 mg at 6 PM to minimize sundowning and delirium - DC LTM EEG as no seizures - Patient appears to be hypertensive.  Discussed with hospitalist to resume home antihypertensives for goal normotension - Seizure precautions - As needed IV Ativan for clinical seizures - Follow-up with neurology in 2 to 3 months (order placed) - Discussed plan with hospitalist via secure chat and with family at bedside    I personally spent a total of in the care of the patient today including getting/reviewing separately obtained history, performing a medically appropriate exam/evaluation, counseling and educating, placing orders, referring and communicating with other health care professionals, documenting clinical information in the EHR, independently interpreting results, and coordinating care.           Arlin Krebs Epilepsy [facility] For questions after 5pm please refer to AMION to  reach the Neurologist on call

## 2023-12-01 NOTE — Progress Notes (Signed)
 Speech Language Pathology Treatment: Dysphagia  [name] MRN: [id number] DOB: 1940/11/13 Today's Date: 12/01/2023 Time: 0943-1000 SLP Time Calculation (min) (ACUTE ONLY): 17 min  Assessment / Plan / Recommendation Clinical Impression  Pt's cognition and awareness is slowly improving per clinical observations and family report. She was not agitated or moaning today and sustained attention to po's with some distraction and restlessness. She is speaking more however quality is a whisper likely due to cognitive status- family states she was speaking in a higher pitch quality post seizure. Observed with pop tart from home given she is allergic to gluten. Exhibits oral impairments with mildly prolonged mastication and lingual residue needing liquid was to clear (family states she chews slow at baseline- educated this is likely exacerbated by current medical status). Multiple straw sips thin observed without indications of decreased airway protection consistently throughout session. Recommend she initiate Dys 2 (minced), thin, pills whole in puree, limit distractions and check for oral pocketing. ST will follow and for safety/efficiency with recommendations and upgrade when/if able.    HPI HPI: 83 y.o. female with medical history significant of hypertension, hyperlipidemia, CKD 3A, dementia, and hypothyroidism presented with a new onset seizure. She is accompanied by her husband and daughter.  Patient was hospitalized for further management.  Placed on LTM. CXR negative for acute abnormalities.  MRI remarkable for Subtle cortical restricted diffusion in the posterior right temporal and  parietal lobes, most likely secondary to reported seizures      SLP Plan  Continue with current plan of care          Recommendations  Diet recommendations: Dysphagia 2 (fine chop);Thin liquid Liquids provided via: Cup;Straw Medication Administration: Whole meds with  puree Supervision: Staff to assist with self feeding;Full supervision/cueing for compensatory strategies Compensations: Slow rate;Small sips/bites;Minimize environmental distractions;Lingual sweep for clearance of pocketing Postural Changes and/or Swallow Maneuvers: Seated upright 90 degrees                  Oral care BID   Frequent or constant Supervision/Assistance Dysphagia, unspecified (R13.10)     Continue with current plan of care     Frances Phillips  12/01/2023, 10:41 AM

## 2023-12-01 NOTE — Progress Notes (Signed)
 Inpatient Rehab Admissions Coordinator Note:  Per therapy recommendations patient was screened for CIR candidacy by Reche FORBES Lowers, PT. At this time, pt does not appear to demonstrate medical necessity to support a CIR admission.  We will not pursue a rehab consult at this time.  Recommend other rehab venues to be pursued.  Please contact me with any questions.  Sanah Kraska E Dannette Kinkaid, [place] [phone] 12/01/23  10:16 PM

## 2023-12-01 NOTE — Procedures (Signed)
 Patient Name: SARALYNN LANGHORST  MRN: [id number]  Epilepsy Attending: Arlin MALVA Krebs  Referring Physician/Provider: Khaliqdina, Salman, MD  Duration: 11/30/2023 0900 to 12/01/2023 0900   Patient history:  83 y.o. female with history of mild cognitive impairment, CKD, hypertension and hyperlipidemia who presents with witnessed seizure activity. EEG to evaluate for seizure.    Level of alertness: Awake, asleep   AEDs during EEG study: VPA     Technical aspects: This EEG study was done with scalp electrodes positioned according to the 10-20 International system of electrode placement. Electrical activity was reviewed with band pass filter of 1-70Hz , sensitivity of 7 uV/mm, display speed of 48mm/sec with a 60Hz  notched filter applied as appropriate. EEG data were recorded continuously and digitally stored.  Video monitoring was available and reviewed as appropriate.   Description: The posterior dominant rhythm consists of 9Hz  activity of moderate voltage (25-35 uV) seen predominantly in posterior head regions, symmetric and reactive to eye opening and eye closing. Sleep was characterized by vertex waves, sleep spindles (12 to 14 Hz), maximal frontocentral region.  There is intermittent 3 to 6 Hz theta-delta slowing in right temporo-parietal region. Intermittent generalized 3-6hz  theta-delta slowing was also noted. Hyperventilation and photic stimulation were not performed.     ABNORMALITY -  Intermittent slow, right temporo-parietal region  IMPRESSION: This study is suggestive of cortical dysfunction arising from right temporo-parietal region likely secondary to underlying structural abnormality/ post-ictal state. No seizures or epileptiform discharges were seen throughout the recording.   Ellisyn Icenhower O Ekam Bonebrake

## 2023-12-01 NOTE — Evaluation (Signed)
 Physical Therapy Evaluation [name] MRN: [id number] DOB: September 27, 1940 Today's Date: 12/01/2023  History of Present Illness  Frances Phillips is a 83 y.o. female presents with a new onset seizure. PMH: hypertension, hyperlipidemia, CKD 3A, dementia, and hypothyroidism Dtr reports falling in shower approx 3 weeks ago hitting L side of head resulting in a lump but didn't seek medical attn.   Clinical Impression  Pt admitted with above. PTA pt indep without AD, enjoyed  yard work, mowing her grass on a riding Surveyor, mining, and tending to her garden. Pt with some short term memory deficits at baseline but overall very functional. Pt now presenting with decreased safety awareness, impulsivity, restlessness, impaired coordination, impaired sequencing, generalized weakness and assistx2 for transfer OOB to chair. At this time recommending inpatient rehab program > 3 hrs a day to progress back to safe mod I level of function. Acute PT to cont to follow.        If plan is discharge home, recommend the following: A lot of help with walking and/or transfers;A lot of help with bathing/dressing/bathroom;Supervision due to cognitive status;Help with stairs or ramp for entrance;Assist for transportation;Direct supervision/assist for medications management;Direct supervision/assist for financial management;Assistance with feeding   Can travel by private vehicle        Equipment Recommendations None recommended by PT  Recommendations for Other Services  Rehab consult    Functional Status Assessment Patient has had a recent decline in their functional status and demonstrates the ability to make significant improvements in function in a reasonable and predictable amount of time.     Precautions / Restrictions Precautions Precautions: Fall (seizure) Recall of Precautions/Restrictions: Impaired Precaution/Restrictions Comments: pt on continuous EEG Restrictions Weight Bearing  Restrictions Per Provider Order: No      Mobility  Bed Mobility Overal bed mobility: Needs Assistance Bed Mobility: Rolling, Supine to Sit Rolling: Contact guard assist   Supine to sit: Mod assist     General bed mobility comments: pt restless in bed, rolling from L /R side in bed. Eager to get out of bed but consistently falling to R side, modA for trunk elevation and to sit EOB, mod/maxA to maiintain EOB sitting due to R lateral lean    Transfers Overall transfer level: Needs assistance Equipment used: 2 person hand held assist (2 person face to face transfer with use of gait belt) Transfers: Sit to/from Stand, Bed to chair/wheelchair/BSC Sit to Stand: Mod assist, +2 physical assistance   Step pivot transfers: Mod assist, +2 physical assistance       General transfer comment: pt with poor trunk control, required modAx2 to weight shift to initiate stepping sequence to chair, pt with impaired coordination most likely due to combo of weakness and neuropathy in bilat feet.    Ambulation/Gait               General Gait Details: unable to safely complete this date  Stairs            Wheelchair Mobility     Tilt Bed    Modified Rankin (Stroke Patients Only)       Balance Overall balance assessment: Needs assistance Sitting-balance support: Feet supported, Bilateral upper extremity supported Sitting balance-Leahy Scale: Zero Sitting balance - Comments: pt with R lateral lean, pt would correct to midline to verbal and tactile cues but unable to maintain   Standing balance support: Bilateral upper extremity supported, During functional activity Standing balance-Leahy Scale: Zero Standing balance comment: reliant on  external support                             Pertinent Vitals/Pain Pain Assessment Pain Assessment: Faces Faces Pain Scale: Hurts a little bit Pain Location: restless in the bed    Home Living Family/patient expects to be  discharged to:: Private residence Living Arrangements: Spouse/significant other Available Help at Discharge: Family;Available 24 hours/day Type of Home: House Home Access: Ramped entrance       Home Layout: One level Home Equipment: Agricultural consultant (2 wheels);Rollator (4 wheels);Shower seat;BSC/3in1;Grab bars - tub/shower;Wheelchair - manual      Prior Function Prior Level of Function : Independent/Modified Independent;Driving             Mobility Comments: 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, yard work, tends to garden, Lexicographer, [employer], no AD ADLs Comments: indep with exception of cooking     Extremity/Trunk Assessment   Upper Extremity Assessment Upper Extremity Assessment: Defer to OT evaluation    Lower Extremity Assessment Lower Extremity Assessment: RLE deficits/detail;LLE deficits/detail;Generalized weakness RLE Deficits / Details: impaired coordination, generalized weakness LLE Deficits / Details: impaired coordination, generalized weakness    Cervical / Trunk Assessment Cervical / Trunk Assessment: Other exceptions (truncal weakness)  Communication   Communication Communication: Impaired Factors Affecting Communication: Hearing impaired;Reduced clarity of speech    Cognition Arousal: Alert Behavior During Therapy: Restless   PT - Cognitive impairments: History of cognitive impairments                       PT - Cognition Comments: known dementia however per dtr patient usually appropriate. At this time pt impulsive with impared safety awareness and insight to situation/deficits Following commands: Impaired Following commands impaired: Follows one step commands inconsistently, Follows one step commands with increased time     Cueing Cueing Techniques: Verbal cues, Gestural cues, Tactile cues     General Comments General comments (skin integrity, edema, etc.): continuous EEG remains intact. BP elevated at 198/82    Exercises     Assessment/Plan    PT  Assessment Patient needs continued PT services  PT Problem List Decreased strength;Decreased activity tolerance;Decreased balance;Decreased mobility;Decreased coordination;Decreased cognition;Decreased knowledge of use of DME;Decreased safety awareness       PT Treatment Interventions DME instruction;Gait training;Functional mobility training;Therapeutic activities;Therapeutic exercise;Balance training;Neuromuscular re-education;Cognitive remediation    PT Goals (Current goals can be found in the Care Plan section)  Acute Rehab PT Goals Patient Stated Goal: home PT Goal Formulation: With patient Time For Goal Achievement: 12/15/23 Potential to Achieve Goals: Good    Frequency Min 3X/week     Co-evaluation PT/OT/SLP Co-Evaluation/Treatment: Yes Reason for Co-Treatment: To address functional/ADL transfers PT goals addressed during session: Mobility/safety with mobility         AM-PAC PT 6 Clicks Mobility  Outcome Measure Help needed turning from your back to your side while in a flat bed without using bedrails?: A Little Help needed moving from lying on your back to sitting on the side of a flat bed without using bedrails?: A Lot Help needed moving to and from a bed to a chair (including a wheelchair)?: A Lot Help needed standing up from a chair using your arms (e.g., wheelchair or bedside chair)?: A Lot Help needed to walk in hospital room?: Total Help needed climbing 3-5 steps with a railing? : Total 6 Click Score: 11    End of Session Equipment Utilized During Treatment: Gait belt  Activity Tolerance: Patient limited by fatigue Patient left: in chair;with call bell/phone within reach;with chair alarm set;with family/visitor present Nurse Communication: Mobility status (need for 2 people to step pvt back to bed) PT Visit Diagnosis: Unsteadiness on feet (R26.81);Muscle weakness (generalized) (M62.81);Difficulty in walking, not elsewhere classified (R26.2)    Time:  9180-9164 PT Time Calculation (min) (ACUTE ONLY): 16 min   Charges:   PT Evaluation $PT Eval Low Complexity: 1 Low   PT General Charges $$ ACUTE PT VISIT: 1 Visit        Norene Ames, PT, DPT Acute Rehabilitation Services Secure chat preferred Office #: [phone]   Norene CHRISTELLA Ames 12/01/2023, 8:51 AM

## 2023-12-01 NOTE — Progress Notes (Addendum)
 PROGRESS NOTE        [name] Age: 84 y.o. Sex: female Date of Birth: 18-Jun-1940 Admit Date: 11/28/2023 Admitting Physician Maximino DELENA Sharps, MD ERE:[email or web address], Juliene BROCKS, MD  Brief Summary: Patient is a 83 y.o.  female with history dementia-presented to ED with new onset seizures.  Significant events: 10/11>> admit to [facility]  Significant studies: 10/10>> MRI brain: No acute CVA. 10/10>> CTA head/neck: No acute intracranial/vascular abnormality- no significant stenosis 10/11-10/12>> LTM EEG: No seizures.  Significant microbiology data: None  Procedures: None  Consults: Neurology  Subjective: Sleepy but easily arousable-able to tell me her name-age-follows simple commands.  Daughter at bedside.  No major issues overnight.  Objective: Vitals: Blood pressure (!) 164/57, pulse 70, temperature 97.8 F (36.6 C), temperature source Oral, resp. rate 18, SpO2 98%.   Exam: Gen Exam:Alert awake-not in any distress HEENT:atraumatic, normocephalic Chest: B/L clear to auscultation anteriorly CVS:S1S2 regular Abdomen:soft non tender, non distended Extremities:no edema Neurology: Non focal Skin: no rash  Pertinent Labs/Radiology:    Latest Ref Rng & Units 12/01/2023    3:55 AM 11/30/2023   12:44 PM 11/29/2023    8:40 AM  CBC  WBC 4.0 - 10.5 K/uL 7.1  6.5    Hemoglobin 12.0 - 15.0 g/dL 89.8  89.9  8.9   Hematocrit 36.0 - 46.0 % 28.9  28.0  25.3   Platelets 150 - 400 K/uL 201  190      Lab Results  Component Value Date   NA 140 12/01/2023   K 3.6 12/01/2023   CL 107 12/01/2023   CO2 22 12/01/2023      Assessment/Plan: Acute metabolic encephalopathy In the setting of acute onset of seizures/post ictal state Mental status improved-continue to treat underlying seizures.  New onset seizures Likely provoked in the setting of dementia MRI brain without any structural issues On Depakote-no further overt seizures  overnight LTM EEG monitoring ongoing Await input from neurology team.  Oropharyngeal dysphagia In the setting of encephalopathy/somnolence/seizures Thankfully more awake/alert today-await SLP eval. Resume diet if felt safe-based on SLP evaluation.  Dementia with delirium Per daughter at bedside-hallucination last night-fidgety/restless at times LTM EEG ongoing to ensure seizures are not causing her mental status issues. On as needed IV Haldol Maintain strict delirium precautions.  CKD stage IIIa Creatinine close to baseline Has Foley catheter-unclear why this was placed but will remove when a bit more awake/alert/mobile.  Normocytic anemia Mild Suspect this is from acute illness Stable for periodic monitoring.  HTN BP on the higher side Currently n.p.o. due to somnolence/encephalopathy Start as needed IV hydralazine Resume oral antihypertensives when stable for oral intake.  GERD PPI  HLD Resume statin when safe oral intake.  Mood disorder Resume Effexor when safe for oral intake.  Debility/deconditioning Secondary acute illness Await PT/OT eval  Code status:   Code Status: Limited: Do not attempt resuscitation (DNR) -DNR-LIMITED -Do Not Intubate/DNI    DVT Prophylaxis: heparin injection 5,000 Units Start: 11/30/23 0845 SCDs Start: 11/29/23 0745   Family Communication: Daughter at bedside   Disposition Plan: Status is: Inpatient Remains inpatient appropriate because: Severity of illness   Planned Discharge Destination:Home health versus SNF   Diet: Diet Order             Diet NPO time specified  Diet effective midnight  Antimicrobial agents: Anti-infectives (From admission, onward)    None        MEDICATIONS: Scheduled Meds:  heparin injection (subcutaneous)  5,000 Units Subcutaneous Q8H   pantoprazole (PROTONIX) IV  40 mg Intravenous Q24H   sodium chloride  flush  3 mL Intravenous Q12H   Continuous  Infusions:  valproate sodium 250 mg (12/01/23 0604)   PRN Meds:.acetaminophen **OR** acetaminophen, albuterol , haloperidol lactate, hydrALAZINE, LORazepam, ondansetron  **OR** ondansetron  (ZOFRAN ) IV   I have personally reviewed following labs and imaging studies  LABORATORY DATA: CBC: Recent Labs  Lab 11/28/23 1937 11/29/23 0128 11/29/23 0840 11/30/23 1244 12/01/23 0355  WBC 9.7 11.2*  --  6.5 7.1  NEUTROABS 7.3 9.3*  --   --   --   HGB 10.3* 8.8* 8.9* 10.0* 10.1*  HCT 30.8* 26.1* 25.3* 28.0* 28.9*  MCV 91.7 90.3  --  85.1 85.8  PLT 218 176  --  190 201    Basic Metabolic Panel: Recent Labs  Lab 11/28/23 1937 11/29/23 0128 11/30/23 1244 12/01/23 0355  NA 136 140 138 140  K 3.9 4.4 3.7 3.6  CL 104 107 107 107  CO2 17* 20* 20* 22  GLUCOSE 125* 105* 99 86  BUN 22 17 11 15   CREATININE 1.22* 1.17* 0.97 1.10*  CALCIUM 8.8* 8.1* 9.1 9.6  MG 1.7  --   --  1.7    GFR: Estimated Creatinine Clearance: 35.4 mL/min (A) (by C-G formula based on SCr of 1.1 mg/dL (H)).  Liver Function Tests: Recent Labs  Lab 11/28/23 1937 11/29/23 0128 12/01/23 0355  AST 42* 31 33  ALT 23 20 22   ALKPHOS 71 60 67  BILITOT 0.8 0.6 1.1  PROT 7.1 5.6* 6.8  ALBUMIN 4.4 3.3* 3.8   No results for input(s): LIPASE, AMYLASE in the last 168 hours. No results for input(s): AMMONIA in the last 168 hours.  Coagulation Profile: Recent Labs  Lab 11/28/23 1937  INR 1.0    Cardiac Enzymes: No results for input(s): CKTOTAL, CKMB, CKMBINDEX, TROPONINI in the last 168 hours.  BNP (last 3 results) No results for input(s): PROBNP in the last 8760 hours.  Lipid Profile: No results for input(s): CHOL, HDL, LDLCALC, TRIG, CHOLHDL, LDLDIRECT in the last 72 hours.  Thyroid Function Tests: Recent Labs    11/29/23 0128  TSH 2.336    Anemia Panel: Recent Labs    12/01/23 0355  VITAMINB12 697  FOLATE 18.2  FERRITIN 108  TIBC 336  IRON 62  RETICCTPCT 7.6*     Urine analysis:    Component Value Date/Time   COLORURINE STRAW (A) 11/29/2023 0753   APPEARANCEUR CLEAR 11/29/2023 0753   LABSPEC 1.018 11/29/2023 0753   PHURINE 5.0 11/29/2023 0753   GLUCOSEU NEGATIVE 11/29/2023 0753   HGBUR SMALL (A) 11/29/2023 0753   BILIRUBINUR NEGATIVE 11/29/2023 0753   KETONESUR NEGATIVE 11/29/2023 0753   PROTEINUR NEGATIVE 11/29/2023 0753   NITRITE NEGATIVE 11/29/2023 0753   LEUKOCYTESUR NEGATIVE 11/29/2023 0753    Sepsis Labs: Lactic Acid, Venous No results found for: LATICACIDVEN  MICROBIOLOGY: No results found for this or any previous visit (from the past 240 hours).  RADIOLOGY STUDIES/RESULTS: Overnight EEG with video Result Date: 11/30/2023 Shelton Arlin KIDD, MD     11/30/2023  8:57 AM Patient Name: ROMINA DIVIRGILIO MRN: [id number] Epilepsy Attending: Arlin KIDD Shelton Referring Physician/Provider: Khaliqdina, Salman, MD Duration: 11/29/2023 0817 to 11/30/2023 0900 Patient history:  83 y.o. female with history of mild cognitive impairment, CKD, hypertension and hyperlipidemia  who presents with witnessed seizure activity. EEG to evaluate for seizure. Level of alertness: Awake, asleep AEDs during EEG study: VPA, Ativan  Technical aspects: This EEG study was done with scalp electrodes positioned according to the 10-20 International system of electrode placement. Electrical activity was reviewed with band pass filter of 1-70Hz , sensitivity of 7 uV/mm, display speed of 81mm/sec with a 60Hz  notched filter applied as appropriate. EEG data were recorded continuously and digitally stored.  Video monitoring was available and reviewed as appropriate. Description: The posterior dominant rhythm consists of 9Hz  activity of moderate voltage (25-35 uV) seen predominantly in posterior head regions, symmetric and reactive to eye opening and eye closing. Sleep was characterized by vertex waves, sleep spindles (12 to 14 Hz), maximal frontocentral region.  There is low  amplitude continuous contoured 3 to 6 Hz theta-delta slowing in right temporo-parietal region. Intermittent generalized 3-6hz  theta-delta slowing was also noted. Hyperventilation and photic stimulation were not performed.  EEG was disconnected between 11/29/2023 1818 to 1908 for unclear reason ABNORMALITY - Continuous slow, right temporo-parietal region - Intermittent slow, generalized IMPRESSION: This study is suggestive of cortical dysfunction arising from right temporo-parietal region likely secondary to underlying structural abnormality/ post-ictal state. Additionally there is mild generalized cerebral dysfunction/ encephalopathy. No seizures or epileptiform discharges were seen throughout the recording. Priyanka MALVA Krebs     LOS: 2 days   Donalda Applebaum, MD  [facility]    To contact the attending provider between 7A-7P or the covering provider during after hours 7P-7A, please log into the web site [email or web address] and access using universal [place] password for that web site. If you do not have the password, please call the hospital operator.  12/01/2023, 9:06 AM

## 2023-12-02 DIAGNOSIS — F0390 Unspecified dementia without behavioral disturbance: Secondary | ICD-10-CM | POA: Diagnosis not present

## 2023-12-02 DIAGNOSIS — D72829 Elevated white blood cell count, unspecified: Secondary | ICD-10-CM | POA: Diagnosis not present

## 2023-12-02 DIAGNOSIS — R569 Unspecified convulsions: Secondary | ICD-10-CM | POA: Diagnosis not present

## 2023-12-02 DIAGNOSIS — N1831 Chronic kidney disease, stage 3a: Secondary | ICD-10-CM | POA: Diagnosis not present

## 2023-12-02 MED ORDER — BOOST / RESOURCE BREEZE PO LIQD CUSTOM
1.0000 | Freq: Every day | ORAL | Status: DC
Start: 2023-12-02 — End: 2023-12-04
  Administered 2023-12-02 – 2023-12-03 (×2): 1 via ORAL

## 2023-12-02 MED ORDER — QUETIAPINE FUMARATE 25 MG PO TABS
25.0000 mg | ORAL_TABLET | Freq: Every day | ORAL | Status: DC
  Administered 2023-12-02 – 2023-12-03 (×2): 25 mg via ORAL
  Filled 2023-12-02 (×2): qty 1

## 2023-12-02 MED ORDER — PRAVASTATIN SODIUM 40 MG PO TABS
40.0000 mg | ORAL_TABLET | Freq: Every morning | ORAL | Status: DC
  Administered 2023-12-02 – 2023-12-04 (×3): 40 mg via ORAL
  Filled 2023-12-02 (×3): qty 1

## 2023-12-02 MED ORDER — LEVOTHYROXINE SODIUM 75 MCG PO TABS
75.0000 ug | ORAL_TABLET | Freq: Every day | ORAL | Status: DC
  Administered 2023-12-03 – 2023-12-04 (×2): 75 ug via ORAL
  Filled 2023-12-02 (×2): qty 1

## 2023-12-02 MED ORDER — VITAMIN B-12 1000 MCG PO TABS
1000.0000 ug | ORAL_TABLET | Freq: Every day | ORAL | Status: DC
Start: 2023-12-02 — End: 2023-12-04
  Administered 2023-12-02 – 2023-12-04 (×3): 1000 ug via ORAL
  Filled 2023-12-02 (×3): qty 1

## 2023-12-02 MED ORDER — VENLAFAXINE HCL ER 75 MG PO CP24
75.0000 mg | ORAL_CAPSULE | Freq: Every day | ORAL | Status: DC
  Administered 2023-12-02 – 2023-12-04 (×3): 75 mg via ORAL
  Filled 2023-12-02 (×5): qty 1

## 2023-12-02 MED ORDER — AMLODIPINE BESYLATE 10 MG PO TABS
10.0000 mg | ORAL_TABLET | Freq: Every day | ORAL | Status: DC
  Administered 2023-12-03 – 2023-12-04 (×2): 10 mg via ORAL
  Filled 2023-12-02 (×2): qty 1

## 2023-12-02 MED ORDER — MEMANTINE HCL 10 MG PO TABS
5.0000 mg | ORAL_TABLET | Freq: Two times a day (BID) | ORAL | Status: DC
  Administered 2023-12-02 – 2023-12-04 (×5): 5 mg via ORAL
  Filled 2023-12-02 (×5): qty 1

## 2023-12-02 MED ORDER — ADULT MULTIVITAMIN W/MINERALS CH
1.0000 | ORAL_TABLET | Freq: Every day | ORAL | Status: DC
  Administered 2023-12-02 – 2023-12-04 (×3): 1 via ORAL
  Filled 2023-12-02 (×3): qty 1

## 2023-12-02 MED ORDER — MELATONIN 5 MG PO TABS
5.0000 mg | ORAL_TABLET | Freq: Every day | ORAL | Status: DC
  Administered 2023-12-02 – 2023-12-03 (×2): 5 mg via ORAL
  Filled 2023-12-02 (×2): qty 1

## 2023-12-02 NOTE — TOC CM/SW Note (Addendum)
 Transition of Care (TOC) CM/SW Note   Per chart review, patient is not fully oriented. CIR declined. Attempted call to spouse to discuss SNF, no answer and VM full. Will reattempt later.  Ivett [place], [place] [phone]

## 2023-12-02 NOTE — Progress Notes (Signed)
 Initial Nutrition Assessment  DOCUMENTATION CODES:   Not applicable  INTERVENTION:  Collect new weight to establish trend Add Boost Breeze po once daily, each supplement provides 250 kcal and 9 grams of protein  Add MVI w/ minerals Monitor need for scheduled bowel regimen Add Magic cup TID with meals, each supplement provides 290 kcal and 9 grams of protein  Feeding assistance with meals to encourage maximum intake  NUTRITION DIAGNOSIS:  Inadequate oral intake related to inability to eat as evidenced by other (comment) (AMS).   GOAL:  Patient will meet greater than or equal to 90% of their needs   MONITOR:  PO intake, Supplement acceptance, Diet advancement, Weight trends  REASON FOR ASSESSMENT:  Consult Assessment of nutrition requirement/status  ASSESSMENT:   Pt with PMH significant for: dementia, HTN, celiac disease, and thyroid disease who is admitted for acute metabolic encephalopathy in the setting of acute onset of seizures/post ictal state superimposed on dementia.  10/10: admitted 10/11-10/12: LTM EEG: No seizures 10/13: advanced to DYS2/thins  Experiencing delirium/agitation overnight and required Haldol. Overall, mental status has improved. MRI and LTM EEG both negative.   Average Meal Intake 10/14: 25% x1 documented meal  Pt husband at bedside while patient sleeps this morning. He reports daughter fed patient breakfast. He cannot report how much she ate, but documentation shows 25% consumed. He reports no changes in appetite/intake PTA. She does not like transitional ONS. Will order [employer] once daily for nutrition support as he reports she does like juice now and again. He also reports she likes ice cream and applesauce. Will add to meal trays.   24 Hour Recall B: eggs and diet coke L: sandwich D: soup and sandwich  She has dx of Celiac disease. No issues chewing or swallowing, at baseline, per husband report. He and his wife take turns cooking.  Daughter lives close by for support. She is the main point of contact for discharge planning. Could not appreciate state of dentition as she was sleeping and husband requested to let her sleep due to events from night prior.  Admit/Current Weight: 65.7 kg   Husband cannot endorse UBW, but states she has not gained or lost significant amount of weight in recent past. No edema on exam. Per chart review, no recent weight hx to review. Next most recent weight from January of 2024. No skin breakdown noted or observed. Last BM was PTA. Will monitor and recommend scheduled bowel regimen if no movement in next 1-2 days.   Drains/Lines: Foley catheter UOP: none documented  Meds: cyanocobalamin, levothyroxine, melatonin, pantoprazole  Labs from 10/13 reviewed:  Na+ 140 (wdl) K+ 3.6 (wdl) Mg 1.7 (wdl) WBC 11.2>6.5>7.1 (wdl) CBGs 86-99 x24 hours  NUTRITION - FOCUSED PHYSICAL EXAM:  Flowsheet Row Most Recent Value  Orbital Region No depletion  Upper Arm Region No depletion  Thoracic and Lumbar Region No depletion  Buccal Region No depletion  Temple Region No depletion  Clavicle Bone Region No depletion  Clavicle and Acromion Bone Region No depletion  Scapular Bone Region No depletion  Dorsal Hand No depletion  Patellar Region Unable to assess  Anterior Thigh Region Unable to assess  Posterior Calf Region Unable to assess  Edema (RD Assessment) None  Hair Reviewed  Eyes Unable to assess  Mouth Unable to assess  Skin Reviewed  Nails Reviewed    Diet Order:   Diet Order             DIET DYS 2 Room service appropriate?  Yes with Assist; Fluid consistency: Thin  Diet effective now             EDUCATION NEEDS:   Not appropriate for education at this time  Skin:  Skin Assessment: Reviewed RN Assessment  Last BM:  PTA  Height:  Ht Readings from Last 1 Encounters:  12/01/23 5' 0.98 (1.549 m)    Weight:  Wt Readings from Last 1 Encounters:  12/01/23 65.7 kg    Ideal Body  Weight:  47.7 kg  BMI:  Body mass index is 27.38 kg/m.  Estimated Nutritional Needs:   Kcal:  1600-1800 kcals  Protein:  60-75g  Fluid:  1.6-1.8L/day  Blair Deaner MS, RD, LDN Registered Dietitian Clinical Nutrition RD Inpatient Contact Info in Amion

## 2023-12-02 NOTE — Plan of Care (Signed)

## 2023-12-02 NOTE — [place] FL2 (Signed)
 [place]  MEDICAID FL2 LEVEL OF CARE FORM     IDENTIFICATION  Patient Name: Frances Phillips Birthdate: 02/21/1940 Sex: female Admission Date (Current Location): 11/28/2023  [facility] and [place] Number:  Producer, television/film/video and Address:  [facility], [address] [place], [place] [address]      Provider Number: [id number]  Attending Physician Name and Address:  Raenelle Donalda HERO, MD  Relative Name and Phone Number:  [name] Hurst   [phone]    Current Level of Care: Hospital Recommended Level of Care: Skilled Nursing Facility Prior Approval Number:    Date Approved/Denied:   PASRR Number: [id number]  Discharge Plan:      Current Diagnoses: Patient Active Problem List   Diagnosis Date Noted   Seizure (HCC) 11/29/2023   Postictal state (HCC) 11/29/2023   Leukocytosis 11/29/2023   Normocytic anemia 11/29/2023   Dementia without behavioral disturbance (HCC) 11/29/2023   Chronic kidney disease, stage III (moderate) (HCC) 11/29/2023    Orientation RESPIRATION BLADDER Height & Weight        Normal Indwelling catheter Weight: 144 lb 13.5 oz (65.7 kg) Height:  5' 0.98 (154.9 cm)  BEHAVIORAL SYMPTOMS/MOOD NEUROLOGICAL BOWEL NUTRITION STATUS      Continent Diet (dys 2)  AMBULATORY STATUS COMMUNICATION OF NEEDS Skin   Extensive Assist Verbally Normal                       Personal Care Assistance Level of Assistance  Bathing, Feeding, Dressing Bathing Assistance: Maximum assistance Feeding assistance: Maximum assistance Dressing Assistance: Maximum assistance     Functional Limitations Info  Speech     Speech Info: Impaired    SPECIAL CARE FACTORS FREQUENCY  PT (By licensed PT), OT (By licensed OT)     PT Frequency: 5 times per week OT Frequency: 5 times per week            Contractures      Additional Factors Info  Code Status, Allergies Code Status Info: Limited: Do not attempt resuscitation (DNR) -DNR-LIMITED  -Do Not Intubate/DNI Allergies Info: Gluten Meal, Percocet (Oxycodone-acetaminophen)           Current Medications (12/02/2023):  This is the current hospital active medication list Current Facility-Administered Medications  Medication Dose Route Frequency Provider Last Rate Last Admin   acetaminophen (TYLENOL) tablet 650 mg  650 mg Oral Q6H PRN Smith, Rondell A, MD   650 mg at 12/01/23 2202   Or   acetaminophen (TYLENOL) suppository 650 mg  650 mg Rectal Q6H PRN Smith, Rondell A, MD   650 mg at 11/30/23 2307   albuterol  (PROVENTIL ) (2.5 MG/3ML) 0.083% nebulizer solution 2.5 mg  2.5 mg Nebulization Q6H PRN Smith, Rondell A, MD       [START ON 12/03/2023] amLODipine (NORVASC) tablet 10 mg  10 mg Oral Daily Ghimire, Donalda HERO, MD       Chlorhexidine Gluconate Cloth 2 % PADS 6 each  6 each Topical Daily Raenelle Donalda HERO, MD   6 each at 12/02/23 9061   cyanocobalamin (VITAMIN B12) tablet 1,000 mcg  1,000 mcg Oral Daily Raenelle Donalda HERO, MD   1,000 mcg at 12/02/23 1254   divalproex (DEPAKOTE) DR tablet 250 mg  250 mg Oral q morning Yadav, Priyanka O, MD   250 mg at 12/02/23 9062   And   divalproex (DEPAKOTE) DR tablet 500 mg  500 mg Oral QHS Yadav, Priyanka O, MD  haloperidol lactate (HALDOL) injection 1 mg  1 mg Intravenous Q6H PRN Segars, Jonathan, MD   1 mg at 12/02/23 1104   heparin injection 5,000 Units  5,000 Units Subcutaneous Q8H Krishnan, Gokul, MD   5,000 Units at 12/02/23 0500   hydrALAZINE (APRESOLINE) injection 10 mg  10 mg Intravenous Q6H PRN Raenelle Donalda HERO, MD   10 mg at 12/02/23 9162   irbesartan (AVAPRO) tablet 150 mg  150 mg Oral Daily Ghimire, Shanker M, MD   150 mg at 12/02/23 0936   [START ON 12/03/2023] levothyroxine (SYNTHROID) tablet 75 mcg  75 mcg Oral QAC breakfast Ghimire, Donalda HERO, MD       LORazepam (ATIVAN) injection 2 mg  2 mg Intravenous Q4H PRN Smith, Rondell A, MD   2 mg at 12/01/23 1322   melatonin tablet 5 mg  5 mg Oral QHS Ghimire, Donalda HERO, MD        memantine [facility]) tablet 5 mg  5 mg Oral BID Raenelle Donalda HERO, MD   5 mg at 12/02/23 1254   ondansetron  (ZOFRAN ) tablet 4 mg  4 mg Oral Q6H PRN Smith, Rondell A, MD       Or   ondansetron  (ZOFRAN ) injection 4 mg  4 mg Intravenous Q6H PRN Claudene, Rondell A, MD       pantoprazole (PROTONIX) injection 40 mg  40 mg Intravenous Q24H Smith, Rondell A, MD   40 mg at 12/02/23 0836   pravastatin (PRAVACHOL) tablet 40 mg  40 mg Oral q morning Raenelle Donalda HERO, MD   40 mg at 12/02/23 1254   QUEtiapine (SEROQUEL) tablet 25 mg  25 mg Oral QHS Ghimire, Donalda HERO, MD       sodium chloride  flush (NS) 0.9 % injection 3 mL  3 mL Intravenous Q12H Smith, Rondell A, MD   3 mL at 12/02/23 0840   venlafaxine XR (EFFEXOR-XR) 24 hr capsule 75 mg  75 mg Oral Daily Ghimire, Shanker M, MD   75 mg at 12/02/23 1255     Discharge Medications: Please see discharge summary for a list of discharge medications.  Relevant Imaging Results:  Relevant Lab Results:   Additional Information SS #: [id number]  Bethzy Hauck E Tatyanna Cronk, LCSW

## 2023-12-02 NOTE — Progress Notes (Signed)
 Physical Therapy Treatment [name] MRN: [id number] DOB: 08-13-1940 Today's Date: 12/02/2023   History of Present Illness Frances Phillips is a 83 y.o. female presents with a new onset seizure. PMH: hypertension, hyperlipidemia, CKD 3A, dementia, and hypothyroidism Dtr reports falling in shower approx 3 weeks ago hitting L side of head resulting in a lump but didn't seek medical attn.    PT Comments  Pt more alert this date however remains restless, confused, demo's decreased insight to safety and deficits, and requires max multimodal cues to complete tasks. Pt was able to amb this date with bilat HHA and modA. Pt to continue to benefit from aggressive inpatient rehab program > 3 hrs to maximize functional return as pt was indep PTA. Acute PT to cont to follow.    If plan is discharge home, recommend the following: A lot of help with walking and/or transfers;A lot of help with bathing/dressing/bathroom;Supervision due to cognitive status;Help with stairs or ramp for entrance;Assist for transportation;Direct supervision/assist for medications management;Direct supervision/assist for financial management;Assistance with feeding   Can travel by private vehicle        Equipment Recommendations  None recommended by PT    Recommendations for Other Services Rehab consult     Precautions / Restrictions Precautions Precautions: Fall Recall of Precautions/Restrictions: Impaired Restrictions Weight Bearing Restrictions Per Provider Order: No     Mobility  Bed Mobility               General bed mobility comments: pt received sitting up in chair    Transfers Overall transfer level: Needs assistance Equipment used: 2 person hand held assist Transfers: Sit to/from Stand, Bed to chair/wheelchair/BSC Sit to Stand: +2 physical assistance, Min assist           General transfer comment: max directional verbal cues    Ambulation/Gait Ambulation/Gait  assistance: Mod assist, +2 physical assistance (family pushing recliner behind patient) Gait Distance (Feet): 70 Feet (x1, 50'x1) Assistive device: 2 person hand held assist Gait Pattern/deviations: Step-through pattern, Decreased stride length, Trunk flexed, Narrow base of support (bilat knee flex) Gait velocity: dec Gait velocity interpretation: <1.8 ft/sec, indicate of risk for recurrent falls   General Gait Details: verbal cues to look ahead and stand up straight, unable to maintain more than 5, pt with progressive trunk and knee flexion with onset of fatigue. during 2nd bout pt with minimal use of bilat UE to push through bilat HHA, most likely due to fatigue   Stairs             Wheelchair Mobility     Tilt Bed    Modified Rankin (Stroke Patients Only)       Balance Overall balance assessment: Needs assistance Sitting-balance support: Feet supported, Bilateral upper extremity supported Sitting balance-Leahy Scale: Poor Sitting balance - Comments: pt with tendency to lean forward   Standing balance support: Bilateral upper extremity supported, During functional activity Standing balance-Leahy Scale: Zero Standing balance comment: reliant on external support                            Communication Communication Communication: Impaired Factors Affecting Communication: Reduced clarity of speech (very soft spoken)  Cognition Arousal: Alert Behavior During Therapy: Restless   PT - Cognitive impairments: History of cognitive impairments                       PT - Cognition Comments:  known dementia however per dtr patient usually appropriate. At this time pt impulsive with impared safety awareness and insight to situation/deficits Following commands: Impaired Following commands impaired: Follows one step commands inconsistently, Follows one step commands with increased time    Cueing Cueing Techniques: Verbal cues, Gestural cues, Tactile cues   Exercises      General Comments General comments (skin integrity, edema, etc.): VSS, RR up to 37 durin gamb, 15-19 during rest      Pertinent Vitals/Pain Pain Assessment Pain Assessment: Faces Faces Pain Scale: Hurts a little bit Pain Location: restlesss in chair Pain Descriptors / Indicators: Restless    Home Living                          Prior Function            PT Goals (current goals can now be found in the care plan section) Acute Rehab PT Goals Patient Stated Goal: home PT Goal Formulation: With patient Time For Goal Achievement: 12/15/23 Potential to Achieve Goals: Good Progress towards PT goals: Progressing toward goals    Frequency    Min 3X/week      PT Plan      Co-evaluation   Reason for Co-Treatment: Complexity of the patient's impairments (multi-system involvement)          AM-PAC PT 6 Clicks Mobility   Outcome Measure  Help needed turning from your back to your side while in a flat bed without using bedrails?: A Little Help needed moving from lying on your back to sitting on the side of a flat bed without using bedrails?: A Lot Help needed moving to and from a bed to a chair (including a wheelchair)?: A Lot Help needed standing up from a chair using your arms (e.g., wheelchair or bedside chair)?: A Lot Help needed to walk in hospital room?: A Lot Help needed climbing 3-5 steps with a railing? : Total 6 Click Score: 12    End of Session Equipment Utilized During Treatment: Gait belt Activity Tolerance: Patient limited by fatigue Patient left: in chair;with call bell/phone within reach;with chair alarm set;with family/visitor present;with nursing/sitter in room Nurse Communication: Mobility status PT Visit Diagnosis: Unsteadiness on feet (R26.81);Muscle weakness (generalized) (M62.81);Difficulty in walking, not elsewhere classified (R26.2)     Time: 9143-9082 PT Time Calculation (min) (ACUTE ONLY): 21 min  Charges:     $Gait Training: 8-22 mins PT General Charges $$ ACUTE PT VISIT: 1 Visit                     Norene Ames, PT, DPT Acute Rehabilitation Services Secure chat preferred Office #: [phone]    Norene CHRISTELLA Ames 12/02/2023, 9:23 AM

## 2023-12-02 NOTE — Care Management Important Message (Signed)
 Important Message  Patient Details  Name: Frances Phillips MRN: [id number] Date of Birth: 06-15-1940   Important Message Given:  Yes - Medicare IM     [name] 12/02/2023, 11:19 AM

## 2023-12-02 NOTE — Progress Notes (Signed)
 PROGRESS NOTE        [name] Age: 83 y.o. Sex: female Date of Birth: 1940/03/21 Admit Date: 11/28/2023 Admitting Physician Maximino DELENA Sharps, MD ERE:[email or web address], Juliene BROCKS, MD  Brief Summary: Patient is a 83 y.o.  female with history dementia-presented to ED with new onset seizures.  Significant events: 10/11>> admit to [facility]  Significant studies: 10/10>> MRI brain: No acute CVA. 10/10>> CTA head/neck: No acute intracranial/vascular abnormality- no significant stenosis 10/11-10/12>> LTM EEG: No seizures. 10/13>> LTM EEG: No seizures.  Significant microbiology data: None  Procedures: None  Consults: Neurology  Subjective: Relatively/alert this morning-daughter at bedside-had delirium/agitation last night-requiring Haldol.  Objective: Vitals: Blood pressure (!) 169/69, pulse 72, temperature 98.1 F (36.7 C), temperature source Oral, resp. rate 15, height 5' 0.98 (1.549 m), weight 65.7 kg, SpO2 98%.   Exam: Gen Exam:awake-not in any distress HEENT:atraumatic, normocephalic Chest: B/L clear to auscultation anteriorly CVS:S1S2 regular Abdomen:soft non tender, non distended Extremities:no edema Neurology: Non focal Skin: no rash  Pertinent Labs/Radiology:    Latest Ref Rng & Units 12/01/2023    3:55 AM 11/30/2023   12:44 PM 11/29/2023    8:40 AM  CBC  WBC 4.0 - 10.5 K/uL 7.1  6.5    Hemoglobin 12.0 - 15.0 g/dL 89.8  89.9  8.9   Hematocrit 36.0 - 46.0 % 28.9  28.0  25.3   Platelets 150 - 400 K/uL 201  190      Lab Results  Component Value Date   NA 140 12/01/2023   K 3.6 12/01/2023   CL 107 12/01/2023   CO2 22 12/01/2023      Assessment/Plan: Acute metabolic encephalopathy In the setting of acute onset of seizures/post ictal state superimposed on dementia. Mental status improved-continue to treat underlying seizures.  New onset seizures Likely provoked in the setting of dementia MRI brain without any  structural issues LTM EEG negative for seizures-no further overt seizures as well. Remains on Depakote  Oropharyngeal dysphagia In the setting of encephalopathy/somnolence/seizures Improved-started on dysphagia 2 diet-per daughter-tolerating well-appetite reasonable.  Dementia with delirium Continues to have hallucination/delirium at night Increase Seroquel to 25 mg Add melatonin Continue as needed IV Haldol Delirium precautions.  CKD stage IIIa Creatinine close to baseline Foley catheter in place-unclear why this was placed-voiding trial/removed tomorrow morning.  Normocytic anemia Mild Suspect this is from acute illness Stable for periodic monitoring.  HTN BP trend improving but still on the high side Increase amlodipine to 10 mg Continue ARB Continue as needed IV hydralazine Follow/optimize.    Hypothyroidism Continue Synthroid.  GERD PPI  HLD Resume statin   Mood disorder Resume Effexor   Debility/deconditioning Secondary acute illness PT/OT eval-probably will need SNF on discharge.  Code status:   Code Status: Limited: Do not attempt resuscitation (DNR) -DNR-LIMITED -Do Not Intubate/DNI    DVT Prophylaxis: heparin injection 5,000 Units Start: 11/30/23 0845 SCDs Start: 11/29/23 0745   Family Communication: Daughter at bedside   Disposition Plan: Status is: Inpatient Remains inpatient appropriate because: Severity of illness   Planned Discharge Destination:Home health versus SNF   Diet: Diet Order             DIET DYS 2 Room service appropriate? Yes with Assist; Fluid consistency: Thin  Diet effective now  Antimicrobial agents: Anti-infectives (From admission, onward)    None        MEDICATIONS: Scheduled Meds:  amLODipine  5 mg Oral Daily   Chlorhexidine Gluconate Cloth  6 each Topical Daily   divalproex  250 mg Oral q morning   And   divalproex  500 mg Oral QHS   heparin injection (subcutaneous)   5,000 Units Subcutaneous Q8H   irbesartan  150 mg Oral Daily   melatonin  5 mg Oral QHS   pantoprazole (PROTONIX) IV  40 mg Intravenous Q24H   QUEtiapine  25 mg Oral QHS   sodium chloride  flush  3 mL Intravenous Q12H   Continuous Infusions:   PRN Meds:.acetaminophen **OR** acetaminophen, albuterol , haloperidol lactate, hydrALAZINE, LORazepam, ondansetron  **OR** ondansetron  (ZOFRAN ) IV   I have personally reviewed following labs and imaging studies  LABORATORY DATA: CBC: Recent Labs  Lab 11/28/23 1937 11/29/23 0128 11/29/23 0840 11/30/23 1244 12/01/23 0355  WBC 9.7 11.2*  --  6.5 7.1  NEUTROABS 7.3 9.3*  --   --   --   HGB 10.3* 8.8* 8.9* 10.0* 10.1*  HCT 30.8* 26.1* 25.3* 28.0* 28.9*  MCV 91.7 90.3  --  85.1 85.8  PLT 218 176  --  190 201    Basic Metabolic Panel: Recent Labs  Lab 11/28/23 1937 11/29/23 0128 11/30/23 1244 12/01/23 0355  NA 136 140 138 140  K 3.9 4.4 3.7 3.6  CL 104 107 107 107  CO2 17* 20* 20* 22  GLUCOSE 125* 105* 99 86  BUN 22 17 11 15   CREATININE 1.22* 1.17* 0.97 1.10*  CALCIUM 8.8* 8.1* 9.1 9.6  MG 1.7  --   --  1.7    GFR: Estimated Creatinine Clearance: 35.4 mL/min (A) (by C-G formula based on SCr of 1.1 mg/dL (H)).  Liver Function Tests: Recent Labs  Lab 11/28/23 1937 11/29/23 0128 12/01/23 0355  AST 42* 31 33  ALT 23 20 22   ALKPHOS 71 60 67  BILITOT 0.8 0.6 1.1  PROT 7.1 5.6* 6.8  ALBUMIN 4.4 3.3* 3.8   No results for input(s): LIPASE, AMYLASE in the last 168 hours. No results for input(s): AMMONIA in the last 168 hours.  Coagulation Profile: Recent Labs  Lab 11/28/23 1937  INR 1.0    Cardiac Enzymes: No results for input(s): CKTOTAL, CKMB, CKMBINDEX, TROPONINI in the last 168 hours.  BNP (last 3 results) No results for input(s): PROBNP in the last 8760 hours.  Lipid Profile: No results for input(s): CHOL, HDL, LDLCALC, TRIG, CHOLHDL, LDLDIRECT in the last 72 hours.  Thyroid  Function Tests: No results for input(s): TSH, T4TOTAL, FREET4, T3FREE, THYROIDAB in the last 72 hours.   Anemia Panel: Recent Labs    12/01/23 0355  VITAMINB12 697  FOLATE 18.2  FERRITIN 108  TIBC 336  IRON 62  RETICCTPCT 7.6*    Urine analysis:    Component Value Date/Time   COLORURINE STRAW (A) 11/29/2023 0753   APPEARANCEUR CLEAR 11/29/2023 0753   LABSPEC 1.018 11/29/2023 0753   PHURINE 5.0 11/29/2023 0753   GLUCOSEU NEGATIVE 11/29/2023 0753   HGBUR SMALL (A) 11/29/2023 0753   BILIRUBINUR NEGATIVE 11/29/2023 0753   KETONESUR NEGATIVE 11/29/2023 0753   PROTEINUR NEGATIVE 11/29/2023 0753   NITRITE NEGATIVE 11/29/2023 0753   LEUKOCYTESUR NEGATIVE 11/29/2023 0753    Sepsis Labs: Lactic Acid, Venous No results found for: LATICACIDVEN  MICROBIOLOGY: No results found for this or any previous visit (from the past 240 hours).  RADIOLOGY STUDIES/RESULTS:  No results found.    LOS: 3 days   Donalda Applebaum, MD  [facility]    To contact the attending provider between 7A-7P or the covering provider during after hours 7P-7A, please log into the web site [email or web address] and access using universal [place] password for that web site. If you do not have the password, please call the hospital operator.  12/02/2023, 10:26 AM

## 2023-12-02 NOTE — TOC Initial Note (Signed)
 Transition of Care [facility]) - Initial/Assessment Note    Patient Details  Name: Frances Phillips MRN: [id number] Date of Birth: 06/26/1940  [facility] CM/SW Contact:    Ymani Porcher E Gilles Trimpe, LCSW Phone Number: 12/02/2023, 1:23 PM  Clinical Narrative:                 Patient was admitted for seizures.  Patient not eligible for CIR.  CSW called patient's spouse x 2, no answer, left voicemails. CSW called patient's daughter Frances Phillips. Frances Phillips states she would be the main contact for DC planning. Patient lives with her spouse, daughter lives nearby. Patient is independent at baseline. Frances Phillips states they would be interested in SNF for STR and prefer the [facility] area.  SNF work up started.    Expected Discharge Plan: Skilled Nursing Facility Barriers to Discharge: Continued Medical Work up   Patient Goals and CMS Choice   CMS Medicare.gov Compare Post Acute Care list provided to:: Patient Represenative (must comment) Choice offered to / list presented to : Adult Children      Expected Discharge Plan and Services       Living arrangements for the past 2 months: Single Family Home                                      Prior Living Arrangements/Services Living arrangements for the past 2 months: Single Family Home Lives with:: Spouse Patient language and need for interpreter reviewed:: Yes Do you feel safe going back to the place where you live?: Yes      Need for Family Participation in Patient Care: Yes (Comment) Care giver support system in place?: Yes (comment)   Criminal Activity/Legal Involvement Pertinent to Current Situation/Hospitalization: No - Comment as needed  Activities of Daily Living   ADL Screening (condition at time of admission) Independently performs ADLs?: No Does the patient have a NEW difficulty with bathing/dressing/toileting/self-feeding that is expected to last >3 days?: Yes (Initiates electronic notice to provider for possible OT  consult) Does the patient have a NEW difficulty with getting in/out of bed, walking, or climbing stairs that is expected to last >3 days?: Yes (Initiates electronic notice to provider for possible PT consult) Does the patient have a NEW difficulty with communication that is expected to last >3 days?: Yes (Initiates electronic notice to provider for possible SLP consult) Is the patient deaf or have difficulty hearing?: No Does the patient have difficulty seeing, even when wearing glasses/contacts?: No Does the patient have difficulty concentrating, remembering, or making decisions?: Yes  Permission Sought/Granted Permission sought to share information with : Facility Industrial/product designer granted to share information with : Yes, Verbal Permission Granted (by daughter Frances Phillips)     Permission granted to share info w AGENCY: SNFs        Emotional Assessment       Orientation: : Fluctuating Orientation (Suspected and/or reported Sundowners) Alcohol / Substance Use: Not Applicable Psych Involvement: No (comment)  Admission diagnosis:  Status epilepticus (HCC) [G40.901] Seizure (HCC) [R56.9] Patient Active Problem List   Diagnosis Date Noted   Seizure (HCC) 11/29/2023   Postictal state (HCC) 11/29/2023   Leukocytosis 11/29/2023   Normocytic anemia 11/29/2023   Dementia without behavioral disturbance (HCC) 11/29/2023   Chronic kidney disease, stage III (moderate) (HCC) 11/29/2023   PCP:  Ernestine Juliene BROCKS, MD Pharmacy:   [facility] [address] [place] -  [address] [place] [address] Phone: [phone] Fax: [phone]     Social Drivers of Health (SDOH) Social History: SDOH Screenings   Food Insecurity: Patient Unable To Answer (11/30/2023)  Housing: Unknown (11/30/2023)  Transportation Needs: Patient Unable To Answer (11/30/2023)  Utilities: Patient Unable To Answer (11/30/2023)  Financial Resource Strain: Low Risk  (01/06/2019)    [facility]  Social Connections: Patient Unable To Answer (11/30/2023)  Tobacco Use: Low Risk  (11/28/2023)  Health Literacy: Low Risk  (05/28/2020)   Received from [facility]   SDOH Interventions:     Readmission Risk Interventions     No data to display

## 2023-12-03 DIAGNOSIS — F0390 Unspecified dementia without behavioral disturbance: Secondary | ICD-10-CM | POA: Diagnosis not present

## 2023-12-03 DIAGNOSIS — R569 Unspecified convulsions: Secondary | ICD-10-CM | POA: Diagnosis not present

## 2023-12-03 DIAGNOSIS — D649 Anemia, unspecified: Secondary | ICD-10-CM | POA: Diagnosis not present

## 2023-12-03 LAB — BASIC METABOLIC PANEL WITH GFR
Anion gap: 11 (ref 5–15)
BUN: 17 mg/dL (ref 8–23)
CO2: 22 mmol/L (ref 22–32)
Calcium: 9.6 mg/dL (ref 8.9–10.3)
Chloride: 103 mmol/L (ref 98–111)
Creatinine, Ser: 1.18 mg/dL — ABNORMAL HIGH (ref 0.44–1.00)
GFR, Estimated: 47 mL/min — ABNORMAL LOW (ref >60)
Glucose, Bld: 105 mg/dL — ABNORMAL HIGH (ref 70–99)
Potassium: 3.5 mmol/L (ref 3.5–5.1)
Sodium: 136 mmol/L (ref 135–145)

## 2023-12-03 LAB — MAGNESIUM: Magnesium: 1.6 mg/dL — ABNORMAL LOW (ref 1.7–2.4)

## 2023-12-03 MED ORDER — SENNOSIDES-DOCUSATE SODIUM 8.6-50 MG PO TABS
2.0000 | ORAL_TABLET | Freq: Every day | ORAL | Status: DC
  Administered 2023-12-03: 2 via ORAL
  Filled 2023-12-03: qty 2

## 2023-12-03 MED ORDER — BISACODYL 10 MG RE SUPP: 10.0000 mg | Freq: Every day | RECTAL | Status: DC | PRN

## 2023-12-03 MED ORDER — MAGNESIUM SULFATE 4 GM/100ML IV SOLN
4.0000 g | Freq: Once | INTRAVENOUS | Status: AC
  Administered 2023-12-03: 4 g via INTRAVENOUS
  Filled 2023-12-03: qty 100

## 2023-12-03 MED ORDER — POLYETHYLENE GLYCOL 3350 17 G PO PACK
17.0000 g | PACK | Freq: Every day | ORAL | Status: DC
  Administered 2023-12-03 – 2023-12-04 (×2): 17 g via ORAL
  Filled 2023-12-03 (×2): qty 1

## 2023-12-03 MED ORDER — IRBESARTAN 300 MG PO TABS
300.0000 mg | ORAL_TABLET | Freq: Every day | ORAL | Status: DC
  Administered 2023-12-04: 300 mg via ORAL
  Filled 2023-12-03: qty 1

## 2023-12-03 NOTE — Progress Notes (Signed)
 PROGRESS NOTE        [name] Age: 83 y.o. Sex: female Date of Birth: 15-Feb-1941 Admit Date: 11/28/2023 Admitting Physician Maximino DELENA Sharps, MD ERE:[email or web address], Juliene BROCKS, MD  Brief Summary: Patient is a 83 y.o.  female with history dementia-presented to ED with new onset seizures.  Significant events: 10/11>> admit to [facility]  Significant studies: 10/10>> MRI brain: No acute CVA. 10/10>> CTA head/neck: No acute intracranial/vascular abnormality- no significant stenosis 10/11-10/12>> LTM EEG: No seizures. 10/13>> LTM EEG: No seizures.  Significant microbiology data: None  Procedures: None  Consults: Neurology  Subjective: Much more awake and alert-slept well last night with Seroquel/melatonin.  Daughter debating whether to take her home with home health or go to SNF.  Wants to see how she does with physical therapy today.  Objective: Vitals: Blood pressure (!) 171/61, pulse 61, temperature 98.2 F (36.8 C), temperature source Oral, resp. rate 16, height 5' 0.98 (1.549 m), weight 65.7 kg, SpO2 99%.   Exam: Gen Exam:Alert awake-not in any distress HEENT:atraumatic, normocephalic Chest: B/L clear to auscultation anteriorly CVS:S1S2 regular Abdomen:soft non tender, non distended Extremities:no edema Neurology: Non focal Skin: no rash  Pertinent Labs/Radiology:    Latest Ref Rng & Units 12/01/2023    3:55 AM 11/30/2023   12:44 PM 11/29/2023    8:40 AM  CBC  WBC 4.0 - 10.5 K/uL 7.1  6.5    Hemoglobin 12.0 - 15.0 g/dL 89.8  89.9  8.9   Hematocrit 36.0 - 46.0 % 28.9  28.0  25.3   Platelets 150 - 400 K/uL 201  190      Lab Results  Component Value Date   NA 136 12/03/2023   K 3.5 12/03/2023   CL 103 12/03/2023   CO2 22 12/03/2023      Assessment/Plan: Acute metabolic encephalopathy In the setting of acute onset of seizures/post ictal state superimposed on dementia. Mental status improved-continue to treat  underlying seizures.  New onset seizures Likely provoked in the setting of dementia MRI brain without any structural issues LTM EEG negative for seizures-no further overt seizures as well. Remains on Depakote  Oropharyngeal dysphagia In the setting of encephalopathy/somnolence/seizures Improved-followed closely by SLP-initially on dysphagia 2 diet-now has been upgraded to a regular diet.   Dementia with delirium Much improved-slept well last night-relatively awake/alert this morning. Continue Seroquel/melatonin Delirium precautions   CKD stage IIIa Creatinine close to baseline Foley catheter discontinued this morning-voiding trial in progress.  Normocytic anemia Mild Suspect this is from acute illness Stable for periodic monitoring.  HTN BP better but on the higher side Continue amlodipine increase Avapro to 300 mg daily. Continue as needed IV hydralazine Follow/optimize.    Hypomagnesemia Replete/recheck.  Hypothyroidism Continue Synthroid.  GERD PPI  HLD statin   Mood disorder Effexor   Debility/deconditioning Secondary acute illness PT/OT eval ongoing high SNF vs home health-family debating/discussing options..  Code status:   Code Status: Limited: Do not attempt resuscitation (DNR) -DNR-LIMITED -Do Not Intubate/DNI    DVT Prophylaxis: heparin injection 5,000 Units Start: 11/30/23 0845 SCDs Start: 11/29/23 0745   Family Communication: Daughter at bedside   Disposition Plan: Status is: Inpatient Remains inpatient appropriate because: Severity of illness   Planned Discharge Destination:Home health versus SNF   Diet: Diet Order  Diet regular Room service appropriate? Yes with Assist; Fluid consistency: Thin  Diet effective now                     Antimicrobial agents: Anti-infectives (From admission, onward)    None        MEDICATIONS: Scheduled Meds:  amLODipine  10 mg Oral Daily   Chlorhexidine Gluconate Cloth   6 each Topical Daily   cyanocobalamin  1,000 mcg Oral Daily   divalproex  250 mg Oral q morning   And   divalproex  500 mg Oral QHS   feeding supplement  1 Container Oral Daily   heparin injection (subcutaneous)  5,000 Units Subcutaneous Q8H   irbesartan  150 mg Oral Daily   levothyroxine  75 mcg Oral QAC breakfast   melatonin  5 mg Oral QHS   memantine  5 mg Oral BID   multivitamin with minerals  1 tablet Oral Daily   pantoprazole (PROTONIX) IV  40 mg Intravenous Q24H   pravastatin  40 mg Oral q morning   QUEtiapine  25 mg Oral QHS   sodium chloride  flush  3 mL Intravenous Q12H   venlafaxine XR  75 mg Oral Daily   Continuous Infusions:  magnesium sulfate bolus IVPB 4 g (12/03/23 1003)    PRN Meds:.acetaminophen **OR** acetaminophen, albuterol , haloperidol lactate, hydrALAZINE, LORazepam, ondansetron  **OR** ondansetron  (ZOFRAN ) IV   I have personally reviewed following labs and imaging studies  LABORATORY DATA: CBC: Recent Labs  Lab 11/28/23 1937 11/29/23 0128 11/29/23 0840 11/30/23 1244 12/01/23 0355  WBC 9.7 11.2*  --  6.5 7.1  NEUTROABS 7.3 9.3*  --   --   --   HGB 10.3* 8.8* 8.9* 10.0* 10.1*  HCT 30.8* 26.1* 25.3* 28.0* 28.9*  MCV 91.7 90.3  --  85.1 85.8  PLT 218 176  --  190 201    Basic Metabolic Panel: Recent Labs  Lab 11/28/23 1937 11/29/23 0128 11/30/23 1244 12/01/23 0355 12/03/23 0355  NA 136 140 138 140 136  K 3.9 4.4 3.7 3.6 3.5  CL 104 107 107 107 103  CO2 17* 20* 20* 22 22  GLUCOSE 125* 105* 99 86 105*  BUN 22 17 11 15 17   CREATININE 1.22* 1.17* 0.97 1.10* 1.18*  CALCIUM 8.8* 8.1* 9.1 9.6 9.6  MG 1.7  --   --  1.7 1.6*    GFR: Estimated Creatinine Clearance: 33 mL/min (A) (by C-G formula based on SCr of 1.18 mg/dL (H)).  Liver Function Tests: Recent Labs  Lab 11/28/23 1937 11/29/23 0128 12/01/23 0355  AST 42* 31 33  ALT 23 20 22   ALKPHOS 71 60 67  BILITOT 0.8 0.6 1.1  PROT 7.1 5.6* 6.8  ALBUMIN 4.4 3.3* 3.8   No results for  input(s): LIPASE, AMYLASE in the last 168 hours. No results for input(s): AMMONIA in the last 168 hours.  Coagulation Profile: Recent Labs  Lab 11/28/23 1937  INR 1.0    Cardiac Enzymes: No results for input(s): CKTOTAL, CKMB, CKMBINDEX, TROPONINI in the last 168 hours.  BNP (last 3 results) No results for input(s): PROBNP in the last 8760 hours.  Lipid Profile: No results for input(s): CHOL, HDL, LDLCALC, TRIG, CHOLHDL, LDLDIRECT in the last 72 hours.  Thyroid Function Tests: No results for input(s): TSH, T4TOTAL, FREET4, T3FREE, THYROIDAB in the last 72 hours.   Anemia Panel: Recent Labs    12/01/23 0355  VITAMINB12 697  FOLATE 18.2  FERRITIN 108  TIBC  336  IRON 62  RETICCTPCT 7.6*    Urine analysis:    Component Value Date/Time   COLORURINE STRAW (A) 11/29/2023 0753   APPEARANCEUR CLEAR 11/29/2023 0753   LABSPEC 1.018 11/29/2023 0753   PHURINE 5.0 11/29/2023 0753   GLUCOSEU NEGATIVE 11/29/2023 0753   HGBUR SMALL (A) 11/29/2023 0753   BILIRUBINUR NEGATIVE 11/29/2023 0753   KETONESUR NEGATIVE 11/29/2023 0753   PROTEINUR NEGATIVE 11/29/2023 0753   NITRITE NEGATIVE 11/29/2023 0753   LEUKOCYTESUR NEGATIVE 11/29/2023 0753    Sepsis Labs: Lactic Acid, Venous No results found for: LATICACIDVEN  MICROBIOLOGY: No results found for this or any previous visit (from the past 240 hours).  RADIOLOGY STUDIES/RESULTS: No results found.    LOS: 4 days   Donalda Applebaum, MD  [facility]    To contact the attending provider between 7A-7P or the covering provider during after hours 7P-7A, please log into the web site [email or web address] and access using universal [place] password for that web site. If you do not have the password, please call the hospital operator.  12/03/2023, 11:50 AM

## 2023-12-03 NOTE — TOC Progression Note (Signed)
 Transition of Care [facility]) - Progression Note    Patient Details  Name: Frances Phillips MRN: [id number] Date of Birth: [birth year]  Transition of Care [facility]) CM/SW Contact  Inocente GORMAN Kindle, LCSW Phone Number: 12/03/2023, 3:32 PM  Clinical Narrative:    CSW met with patient's spouse at bedside. He stated she was able to walk with therapy today and feed herself. He would like to bring patient home instead of SNF. He stated they had home health services prior but they were not good at coming out. He provided CSW with permission to contact his daughter.   CSW spoke with patient's daughter who confirmed plan to bring patient home. She stated patient has never had home health services before, only a nurse from the insurance company. She is accepting of a good agency that is in network with patient's insurance. She stated she has a walker, rollator, wheelchair, walk in shower with bench and has ordered a split king bed so that patient can sit up. She requested a 3in1 and a slide in guard rail but CSW made her aware that is not offered through the hospital. She stated she could order it on [employer]. Requested RNCM set up home health services and deliver 3in1 to the room. Family will transport patient by car.    Expected Discharge Plan: Home w Home Health Services Barriers to Discharge: Continued Medical Work up               Expected Discharge Plan and Services In-house Referral: Clinical Social Work Discharge Planning Services: CM Consult Post Acute Care Choice: Durable Medical Equipment, Home Health Living arrangements for the past 2 months: Single Family Home                                       Social Drivers of Health (SDOH) Interventions SDOH Screenings   Food Insecurity: Patient Unable To Answer (11/30/2023)  Housing: Unknown (11/30/2023)  Transportation Needs: Patient Unable To Answer (11/30/2023)  Utilities: Patient Unable To Answer (11/30/2023)  Financial Resource  Strain: Low Risk  (01/06/2019)   Received from [facility]  Social Connections: Patient Unable To Answer (11/30/2023)  Tobacco Use: Low Risk  (11/28/2023)  Health Literacy: Low Risk  (05/28/2020)   Received from [facility]    Readmission Risk Interventions     No data to display

## 2023-12-03 NOTE — Progress Notes (Signed)
 Physical Therapy Treatment [name] MRN: [id number] DOB: [birth year] Today's Date: 12/03/2023   History of Present Illness ALAIAH Phillips is a 83 y.o. female presents with a new onset seizure. PMH: hypertension, hyperlipidemia, CKD 3A, dementia, and hypothyroidism Dtr reports falling in shower approx 3 weeks ago hitting L side of head resulting in a lump but didn't seek medical attn.    PT Comments  Pt tolerated treatment well today. Pt today was able to progress ambulation distance in hallway today with +2 Min A HHA. DC recs updated to SNF as CIR has signed off. PT will continue to follow.     If plan is discharge home, recommend the following: A lot of help with walking and/or transfers;A lot of help with bathing/dressing/bathroom;Supervision due to cognitive status;Help with stairs or ramp for entrance;Assist for transportation;Direct supervision/assist for medications management;Direct supervision/assist for financial management;Assistance with feeding   Can travel by private vehicle     Yes  Equipment Recommendations  None recommended by PT    Recommendations for Other Services       Precautions / Restrictions Precautions Precautions: Fall Recall of Precautions/Restrictions: Impaired Restrictions Weight Bearing Restrictions Per Provider Order: No     Mobility  Bed Mobility Overal bed mobility: Needs Assistance Bed Mobility: Supine to Sit, Sit to Supine     Supine to sit: Supervision Sit to supine: Supervision   General bed mobility comments: No physical assistance provided.    Transfers Overall transfer level: Needs assistance Equipment used: 2 person hand held assist Transfers: Sit to/from Stand, Bed to chair/wheelchair/BSC Sit to Stand: +2 physical assistance, Min assist           General transfer comment: max directional verbal cues    Ambulation/Gait Ambulation/Gait assistance: +2 physical assistance, +2 safety/equipment,  Min assist Gait Distance (Feet): 150 Feet Assistive device: 2 person hand held assist Gait Pattern/deviations: Step-through pattern, Decreased stride length, Trunk flexed, Narrow base of support, Shuffle Gait velocity: dec     General Gait Details: verbal cues to look ahead and stand up straight, unable to maintain more than 5, pt with progressive trunk and knee flexion with onset of fatigue.   Stairs             Wheelchair Mobility     Tilt Bed    Modified Rankin (Stroke Patients Only)       Balance Overall balance assessment: Needs assistance Sitting-balance support: Feet supported, Bilateral upper extremity supported Sitting balance-Leahy Scale: Poor Sitting balance - Comments: pt with tendency to lean forward   Standing balance support: Bilateral upper extremity supported, During functional activity Standing balance-Leahy Scale: Zero Standing balance comment: reliant on external support                            Communication Communication Communication: Impaired Factors Affecting Communication: Reduced clarity of speech  Cognition Arousal: Alert Behavior During Therapy: Restless   PT - Cognitive impairments: History of cognitive impairments                       PT - Cognition Comments: known dementia however per dtr patient usually appropriate. At this time pt impulsive with impared safety awareness and insight to situation/deficits Following commands: Impaired Following commands impaired: Follows one step commands inconsistently, Follows one step commands with increased time    Cueing Cueing Techniques: Verbal cues, Gestural cues, Tactile cues  Exercises  General Comments General comments (skin integrity, edema, etc.): VSS      Pertinent Vitals/Pain Pain Assessment Pain Assessment: No/denies pain    Home Living                          Prior Function            PT Goals (current goals can now be found  in the care plan section) Progress towards PT goals: Progressing toward goals    Frequency    Min 3X/week      PT Plan      Co-evaluation              AM-PAC PT 6 Clicks Mobility   Outcome Measure  Help needed turning from your back to your side while in a flat bed without using bedrails?: A Little Help needed moving from lying on your back to sitting on the side of a flat bed without using bedrails?: A Lot Help needed moving to and from a bed to a chair (including a wheelchair)?: A Lot Help needed standing up from a chair using your arms (e.g., wheelchair or bedside chair)?: A Lot Help needed to walk in hospital room?: A Lot Help needed climbing 3-5 steps with a railing? : Total 6 Click Score: 12    End of Session Equipment Utilized During Treatment: Gait belt Activity Tolerance: Patient limited by fatigue;Patient tolerated treatment well Patient left: in bed;with call bell/phone within reach;with family/visitor present;with bed alarm set Nurse Communication: Mobility status PT Visit Diagnosis: Unsteadiness on feet (R26.81);Muscle weakness (generalized) (M62.81);Difficulty in walking, not elsewhere classified (R26.2)     Time: 8644-8592 PT Time Calculation (min) (ACUTE ONLY): 12 min  Charges:    $Gait Training: 8-22 mins PT General Charges $$ ACUTE PT VISIT: 1 Visit                     Arayna Illescas B, PT, DPT Acute Rehab Services [phone]    Delman Goshorn 12/03/2023, 3:54 PM

## 2023-12-03 NOTE — Progress Notes (Signed)
 Speech Language Pathology Treatment: Dysphagia  [name] MRN: [id number] DOB: [birth year] Today's Date: 12/03/2023 Time: 8965-8955 SLP Time Calculation (min) (ACUTE ONLY): 10 min  Assessment / Plan / Recommendation Clinical Impression  Pt alert, sitting in chair with improved sustained attention. She fed herself breakfast this am. Observed with gluten free crackers (brought from home) resulting in trace lingual residue and pt independently using tongue sweep and cue for swallow that cleared. She had one cough following thin liquid (after solid trial) that has not been observed prior. Daughter states that pt has asthma and sometimes coughs in the morning with or without liquids. Her CXR was clear and no concerns re: respiratory status in MD notes. Recommend and family in agreement to upgrade to regular texture (family can order meals if desired) and are typically at bedside. Continue thin liquids. Per family she may discharge home tomorrow. ST will discharge from services. Educated family on safe swallow strategies.    HPI HPI: 83 y.o. female with medical history significant of hypertension, hyperlipidemia, CKD 3A, dementia, and hypothyroidism presented with a new onset seizure. She is accompanied by her husband and daughter.  Patient was hospitalized for further management.  Placed on LTM. CXR negative for acute abnormalities.  MRI remarkable for Subtle cortical restricted diffusion in the posterior right temporal and  parietal lobes, most likely secondary to reported seizures      SLP Plan  All goals met;Discharge SLP treatment due to (comment)          Recommendations  Diet recommendations: Regular;Thin liquid Liquids provided via: Cup;Straw Medication Administration: Whole meds with puree Supervision: Patient able to self feed;Full supervision/cueing for compensatory strategies Compensations: Slow rate;Small sips/bites;Lingual sweep for clearance of  pocketing Postural Changes and/or Swallow Maneuvers: Seated upright 90 degrees                  Oral care BID   Intermittent Supervision/Assistance Dysphagia, unspecified (R13.10)     All goals met;Discharge SLP treatment due to (comment)     Dustin Olam Bull  12/03/2023, 10:52 AM

## 2023-12-03 NOTE — TOC Transition Note (Signed)
 Transition of Care [facility]) - Discharge Note   Patient Details  Name: Frances Phillips MRN: [id number] Date of Birth: Dec 31, 1940  Transition of Care [facility]) CM/SW Contact:  Marval Gell, RN Phone Number: 12/03/2023, 3:53 PM   Clinical Narrative:     Patient and family prefer to DC to home, no preference for [facility] provider. [facility] accepted. Orders requested for RN PT OT. They would like BSC, this will be delivered to the room through [facility].   Final next level of care: Home w Home Health Services Barriers to Discharge: No Barriers Identified   Patient Goals and CMS Choice Patient states their goals for this hospitalization and ongoing recovery are:: Return home CMS Medicare.gov Compare Post Acute Care list provided to:: Patient Represenative (must comment) Choice offered to / list presented to : Adult Children [place] ownership interest in [facility].provided to:: Adult Children    Discharge Placement                       Discharge Plan and Services Additional resources added to the After Visit Summary for   In-house Referral: Clinical Social Work Discharge Planning Services: CM Consult Post Acute Care Choice: Durable Medical Equipment, Home Health          DME Arranged: Bedside commode DME Agency: [employer] Date DME Agency Contacted: 12/03/23 Time DME Agency Contacted: [phone] Representative spoke with at DME Agency: [place] HH Arranged: RN, PT, OT [facility] Agency: [facility] Date [facility] Agency Contacted: 12/03/23 Time HH Agency Contacted: [phone] Representative spoke with at [facility] Agency: [facility]  Social Drivers of Health (SDOH) Interventions SDOH Screenings   Food Insecurity: Patient Unable To Answer (11/30/2023)  Housing: Unknown (11/30/2023)  Transportation Needs: Patient Unable To Answer (11/30/2023)  Utilities: Patient Unable To Answer (11/30/2023)  Financial Resource Strain: Low Risk  (01/06/2019)   Received from [facility]  Social  Connections: Patient Unable To Answer (11/30/2023)  Tobacco Use: Low Risk  (11/28/2023)  Health Literacy: Low Risk  (05/28/2020)   Received from [facility]     Readmission Risk Interventions     No data to display

## 2023-12-04 ENCOUNTER — Other Ambulatory Visit (HOSPITAL_COMMUNITY): Payer: Self-pay

## 2023-12-04 LAB — BASIC METABOLIC PANEL WITH GFR
Anion gap: 8 (ref 5–15)
BUN: 19 mg/dL (ref 8–23)
CO2: 26 mmol/L (ref 22–32)
Calcium: 9.2 mg/dL (ref 8.9–10.3)
Chloride: 99 mmol/L (ref 98–111)
Creatinine, Ser: 1.1 mg/dL — ABNORMAL HIGH (ref 0.44–1.00)
GFR, Estimated: 51 mL/min — ABNORMAL LOW (ref >60)
Glucose, Bld: 75 mg/dL (ref 70–99)
Potassium: 3.9 mmol/L (ref 3.5–5.1)
Sodium: 133 mmol/L — ABNORMAL LOW (ref 135–145)

## 2023-12-04 LAB — MAGNESIUM: Magnesium: 2.2 mg/dL (ref 1.7–2.4)

## 2023-12-04 MED ORDER — DIVALPROEX SODIUM 250 MG PO DR TAB
DELAYED_RELEASE_TABLET | ORAL | 2 refills | Status: AC
  Filled 2023-12-04: qty 90, 30d supply, fill #0

## 2023-12-04 MED ORDER — MELATONIN 5 MG PO TABS
5.0000 mg | ORAL_TABLET | Freq: Every day | ORAL | 0 refills | Status: AC
  Filled 2023-12-04: qty 30, 30d supply, fill #0

## 2023-12-04 MED ORDER — QUETIAPINE FUMARATE 25 MG PO TABS
25.0000 mg | ORAL_TABLET | Freq: Every evening | ORAL | 0 refills | Status: AC | PRN
  Filled 2023-12-04: qty 30, 30d supply, fill #0

## 2023-12-04 NOTE — Discharge Summary (Signed)
 [name] Age: 83 y.o. Sex: female Date of Birth: 06/26/1940 MRN: [id number]. Admitting Physician: Frances DELENA Sharps, MD ERE:Tzwsopx, Frances BROCKS, MD  Admit Date: 11/28/2023 Discharge date: 12/04/2023  Recommendations for Outpatient Follow-up:  Follow up with PCP in 1-2 weeks Please obtain CMP/CBC in one week  Admitted From:  Home  Disposition: Home health   Discharge Condition: good  CODE STATUS:   Code Status: Limited: Do not attempt resuscitation (DNR) -DNR-LIMITED -Do Not Intubate/DNI    Diet recommendation:  Diet Order             Diet - low sodium heart healthy           Diet regular Room service appropriate? Yes with Assist; Fluid consistency: Thin  Diet effective now                    Brief Summary: Patient is a 83 y.o.  female with history dementia-presented to ED with new onset seizures.   Significant events: 10/11>> admit to [facility]   Significant studies: 10/10>> MRI brain: No acute CVA. 10/10>> CTA head/neck: No acute intracranial/vascular abnormality- no significant stenosis 10/11-10/12>> LTM EEG: No seizures. 10/13>> LTM EEG: No seizures.   Significant microbiology data: None   Procedures: None   Consults: Neurology  Brief Hospital Course: Acute metabolic encephalopathy In the setting of acute onset of seizures/post ictal state superimposed on dementia. Mental status improved-continue to treat underlying seizures.   New onset seizures Likely provoked in the setting of dementia MRI brain without any structural issues LTM EEG negative for seizures-no further overt seizures as well. Remains on Depakote   Oropharyngeal dysphagia In the setting of encephalopathy/somnolence/seizures Improved-followed closely by SLP-initially on dysphagia 2 diet-now has been upgraded to a regular diet-which patient has been tolerating well.   Dementia with delirium Much improved-slept well last night-relatively awake/alert this  morning. Continue Seroquel/melatonin Delirium precautions    CKD stage IIIa Creatinine close to baseline Follow catheter discontinued 10/15-voiding spontaneously.   Normocytic anemia Mild Suspect this is from acute illness Stable for periodic monitoring.   HTN BP fluctuate but much improved over the past several days Continue amlodipine/ARB.  Hypomagnesemia Repleted   Hypothyroidism Continue Synthroid.   GERD PPI   HLD statin    Mood disorder Effexor    Debility/deconditioning Secondary acute illness Initially family contemplated SNF but she has clinically improved-family comfortable taking her home with home health-family will provide 24/7 supervision.  Nutrition Status: Nutrition Problem: Inadequate oral intake Etiology: inability to eat Signs/Symptoms: other (comment) (AMS) Interventions: MVI, Magic cup, Refer to RD note for recommendations, Boost Breeze    Discharge Diagnoses:  Principal Problem:   Seizure (HCC) Active Problems:   Postictal state (HCC)   Leukocytosis   Normocytic anemia   Dementia without behavioral disturbance (HCC)   Chronic kidney disease, stage III (moderate) (HCC)   Discharge Instructions:  Activity:  As tolerated with Full fall precautions use walker/cane & assistance as needed   Discharge Instructions     Ambulatory referral to Neurology   Complete by: As directed    An appointment is requested in approximately: 8 weeks   Call MD for:  extreme fatigue   Complete by: As directed    Diet - low sodium heart healthy   Complete by: As directed    Discharge instructions   Complete by: As directed    Follow with Primary MD  Frances Frances BROCKS, MD in 1-2 weeks  Follow neurology-the  office will give you a call.  Please get a complete blood count and chemistry panel checked by your Primary MD at your next visit, and again as instructed by your Primary MD.  Get Medicines reviewed and adjusted: Please take all your medications  with you for your next visit with your Primary MD  Laboratory/radiological data: Please request your Primary MD to go over all hospital tests and procedure/radiological results at the follow up, please ask your Primary MD to get all Hospital records sent to his/her office.  In some cases, they will be blood work, cultures and biopsy results pending at the time of your discharge. Please request that your primary care M.D. follows up on these results.  Also Note the following: If you experience worsening of your admission symptoms, develop shortness of breath, life threatening emergency, suicidal or homicidal thoughts you must seek medical attention immediately by calling 911 or calling your MD immediately  if symptoms less severe.  You must read complete instructions/literature along with all the possible adverse reactions/side effects for all the Medicines you take and that have been prescribed to you. Take any new Medicines after you have completely understood and accpet all the possible adverse reactions/side effects.   Do not drive when taking Pain medications or sleeping medications (Benzodaizepines)  Do not take more than prescribed Pain, Sleep and Anxiety Medications. It is not advisable to combine anxiety,sleep and pain medications without talking with your primary care practitioner  Special Instructions: If you have smoked or chewed Tobacco  in the last 2 yrs please stop smoking, stop any regular Alcohol  and or any Recreational drug use.  Wear Seat belts while driving.  Please note: You were cared for by a hospitalist during your hospital stay. Once you are discharged, your primary care physician will handle any further medical issues. Please note that NO REFILLS for any discharge medications will be authorized once you are discharged, as it is imperative that you return to your primary care physician (or establish a relationship with a primary care physician if you do not have one) for  your post hospital discharge needs so that they can reassess your need for medications and monitor your lab values.     Seizure precautions: Per [place]  DMV statutes, patients with seizures are not allowed to drive until they have been seizure-free for six months and cleared by a physician    Use caution when using heavy equipment or power tools. Avoid working on ladders or at heights. Take showers instead of baths. Ensure the water temperature is not too high on the home water heater. Do not go swimming alone. Do not lock yourself in a room alone (i.e. bathroom). When caring for infants or small children, sit down when holding, feeding, or changing them to minimize risk of injury to the child in the event you have a seizure. Maintain good sleep hygiene. Avoid alcohol.    If patient has another seizure, call 911 and bring them back to the ED if: A.  The seizure lasts longer than 5 minutes.      B.  The patient doesn't wake shortly after the seizure or has new problems such as difficulty seeing, speaking or moving following the seizure C.  The patient was injured during the seizure D.  The patient has a temperature over 102 F (39C) E.  The patient vomited during the seizure and now is having trouble breathing    During the Seizure   - First, ensure  adequate ventilation and place patients on the floor on their left side  Loosen clothing around the neck and ensure the airway is patent. If the patient is clenching the teeth, do not force the mouth open with any object as this can cause severe damage - Remove all items from the surrounding that can be hazardous. The patient may be oblivious to what's happening and may not even know what he or she is doing. If the patient is confused and wandering, either gently guide him/her away and block access to outside areas - Reassure the individual and be comforting - Call 911. In most cases, the seizure ends before EMS arrives. However, there are cases  when seizures may last over 3 to 5 minutes. Or the individual may have developed breathing difficulties or severe injuries. If a pregnant patient or a person with diabetes develops a seizure, it is prudent to call an ambulance. - Finally, if the patient does not regain full consciousness, then call EMS. Most patients will remain confused for about 45 to 90 minutes after a seizure, so you must use judgment in calling for help. - Avoid restraints but make sure the patient is in a bed with padded side rails - Place the individual in a lateral position with the neck slightly flexed; this will help the saliva drain from the mouth and prevent the tongue from falling backward - Remove all nearby furniture and other hazards from the area - Provide verbal assurance as the individual is regaining consciousness - Provide the patient with privacy if possible - Call for help and start treatment as ordered by the caregiver    After the Seizure (Postictal Stage)   After a seizure, most patients experience confusion, fatigue, muscle pain and/or a headache. Thus, one should permit the individual to sleep. For the next few days, reassurance is essential. Being calm and helping reorient the person is also of importance.   Most seizures are painless and end spontaneously. Seizures are not harmful to others but can lead to complications such as stress on the lungs, brain and the heart. Individuals with prior lung problems may develop labored breathing and respiratory distress.    Increase activity slowly   Complete by: As directed       Allergies as of 12/04/2023       Reactions   Gluten Meal Itching, Nausea And Vomiting   Fissures/sores in back of throat   Percocet [oxycodone-acetaminophen] Other (See Comments)   Agitation         Medication List     STOP taking these medications    valsartan 160 MG tablet Commonly known as: DIOVAN       TAKE these medications    allopurinol 300 MG  tablet Commonly known as: ZYLOPRIM Take 300 mg by mouth daily.   amLODipine-valsartan 10-320 MG tablet Commonly known as: EXFORGE Take 1 tablet by mouth daily.   cyanocobalamin 1000 MCG tablet Take 1,000 mcg by mouth daily.   diclofenac Sodium 1 % Gel Commonly known as: VOLTAREN diclofenac 1 % topical gel  APPLY TWO GRAMS TO THE AFFECTED AREA(S) FOUR TIMES DAILY   divalproex 250 MG DR tablet Commonly known as: DEPAKOTE Take 1 tablet (250 mg total) by mouth every morning AND 2 tablets (500 mg total) at bedtime.   levothyroxine 75 MCG tablet Commonly known as: SYNTHROID Take 75 mcg by mouth daily before breakfast.   melatonin 5 MG Tabs Take 1 tablet (5 mg total) by mouth at bedtime.  memantine 5 MG tablet Commonly known as: NAMENDA Take 5 mg by mouth 2 (two) times daily.   omeprazole 20 MG capsule Commonly known as: PRILOSEC Take 20 mg by mouth daily.   ondansetron  8 MG disintegrating tablet Commonly known as: ZOFRAN -ODT Take 1 tablet (8 mg total) by mouth every 8 (eight) hours as needed for nausea or vomiting.   pravastatin 40 MG tablet Commonly known as: PRAVACHOL Take 40 mg by mouth every morning.   QUEtiapine 25 MG tablet Commonly known as: SEROQUEL Take 1 tablet (25 mg total) by mouth at bedtime as needed.   venlafaxine 75 MG tablet Commonly known as: EFFEXOR Take 75 mg by mouth daily.               Durable Medical Equipment  (From admission, onward)           Start     Ordered   12/03/23 1543  For home use only DME Bedside commode  Once       Question:  Patient needs a bedside commode to treat with the following condition  Answer:  Weakness   12/03/23 1543            Follow-up Information     [facility] Follow up.   Why: for home health services, they will call you to schedule a hoe visit in 1-2 days Contact information: [address] [place] [address] [phone]         Frances Frances BROCKS, MD.  Schedule an appointment as soon as possible for a visit in 1 week(s).   Specialty: Family Medicine Contact information: [address] [place] [address] [phone]         [facility] Follow up.   Specialty: Neurology Why: Office will call with date/time, If you dont hear from them,please give them a call, Hospital follow up Contact information: [address] [place]  [address] [phone]               Allergies  Allergen Reactions   Gluten Meal Itching and Nausea And Vomiting    Fissures/sores in back of throat    Percocet [Oxycodone-Acetaminophen] Other (See Comments)    Agitation      Other Procedures/Studies: Overnight EEG with video Result Date: 11/30/2023 Shelton Arlin KIDD, MD     11/30/2023  8:57 AM Patient Name: Frances Phillips MRN: [id number] Epilepsy Attending: Arlin KIDD Shelton Referring Physician/Provider: Khaliqdina, Salman, MD Duration: 11/29/2023 0817 to 11/30/2023 0900 Patient history:  83 y.o. female with history of mild cognitive impairment, CKD, hypertension and hyperlipidemia who presents with witnessed seizure activity. EEG to evaluate for seizure. Level of alertness: Awake, asleep AEDs during EEG study: VPA, Ativan  Technical aspects: This EEG study was done with scalp electrodes positioned according to the 10-20 International system of electrode placement. Electrical activity was reviewed with band pass filter of 1-70Hz , sensitivity of 7 uV/mm, display speed of 87mm/sec with a 60Hz  notched filter applied as appropriate. EEG data were recorded continuously and digitally stored.  Video monitoring was available and reviewed as appropriate. Description: The posterior dominant rhythm consists of 9Hz  activity of moderate voltage (25-35 uV) seen predominantly in posterior head regions, symmetric and reactive to eye opening and eye closing. Sleep was characterized by vertex waves, sleep spindles (12  to 14 Hz), maximal frontocentral region.  There is low amplitude continuous contoured 3 to 6 Hz theta-delta slowing in right temporo-parietal  region. Intermittent generalized 3-6hz  theta-delta slowing was also noted. Hyperventilation and photic stimulation were not performed.  EEG was disconnected between 11/29/2023 1818 to 1908 for unclear reason ABNORMALITY - Continuous slow, right temporo-parietal region - Intermittent slow, generalized IMPRESSION: This study is suggestive of cortical dysfunction arising from right temporo-parietal region likely secondary to underlying structural abnormality/ post-ictal state. Additionally there is mild generalized cerebral dysfunction/ encephalopathy. No seizures or epileptiform discharges were seen throughout the recording. Arlin MALVA Krebs   MR BRAIN WO CONTRAST Result Date: 11/28/2023 EXAM: MR Brain without Intravenous Contrast. CLINICAL HISTORY: Neuro deficit, acute, stroke suspected. Table formatting from the original note was not included.; Neuro deficit, acute, stroke suspected ; CODE STROKE- only code stroke sequences run. TECHNIQUE: Magnetic resonance images of the brain without intravenous contrast in multiple planes. CONTRAST: Without. COMPARISON: Same day CT head. FINDINGS: Limited protocol due to provider request. T2 not performed. BRAIN: Subtle cortical restricted diffusion in the posterior right temporal and parietal lobes. No significant edema. No intracranial mass or hemorrhage. No midline shift or extra-axial fluid collection. The central arterial and venous flow voids are patent. VENTRICLES: No hydrocephalus. ORBITS: The orbits are normal. SINUSES AND MASTOIDS: The sinuses and mastoid air cells are clear. BONES: No acute fracture or focal osseous lesion. IMPRESSION: 1. Subtle cortical restricted diffusion in the posterior right temporal and parietal lobes, most likely secondary to reported seizures. No significant edema. Appearance and location is atypical  for infarct. Electronically signed by: Gilmore Molt MD 11/28/2023 09:02 PM EDT RP Workstation: [id number]   DG Chest Portable 1 View Result Date: 11/28/2023 CLINICAL DATA:  Seizure, hypoxia EXAM: PORTABLE CHEST 1 VIEW COMPARISON:  12/09/2010 FINDINGS: Heart and mediastinal contours are within normal limits. No focal opacities or effusions. No acute bony abnormality. Aortic atherosclerosis. IMPRESSION: No active disease. Electronically Signed   By: Franky Crease M.D.   On: 11/28/2023 20:06   CT ANGIO HEAD NECK W WO CM (CODE STROKE) Result Date: 11/28/2023 EXAM: CT HEAD WITHOUT CTA HEAD AND NECK WITH AND WITHOUT 11/28/2023 07:35:19 PM TECHNIQUE: CTA of the head and neck was performed with and without the administration of intravenous contrast (75 mL iohexol (OMNIPAQUE) 350 MG/ML injection). Noncontrast CT of the head with reconstructed 2-D images are also provided for review. Multiplanar 2D and/or 3D reformatted images are provided for review. Automated exposure control, iterative reconstruction, and/or weight based adjustment of the mA/kV was utilized to reduce the radiation dose to as low as reasonably achievable. COMPARISON: None available CLINICAL HISTORY: Neuro deficit, acute, stroke suspected. Code stroke; Left side weakness unresponsive; Lkw 1830 FINDINGS: CT HEAD: BRAIN AND VENTRICLES: No acute intracranial hemorrhage. No mass effect. No midline shift. No extra-axial fluid collection. No evidence of acute infarct. No hydrocephalus. ORBITS: No acute abnormality. SINUSES AND MASTOIDS: Nasal airway is present on the right. CTA NECK: AORTIC ARCH AND ARCH VESSELS: Atherosclerotic calcifications are present at the aortic arch and grade and the origin of left subclavian artery to the rather without focal stenosis or aneurysm. Atherosclerotic calcifications are present at the origin of the right subclavian artery. CERVICAL CAROTID ARTERIES: Atherosclerotic calcifications are present at the carotid  bifurcations bilaterally without significant stenosis. Mild tortuosity is present in the right common and internal carotid artery without focal stenosis. CERVICAL VERTEBRAL ARTERIES: No dissection, arterial injury, or significant stenosis. LUNGS AND MEDIASTINUM: Unremarkable. SOFT TISSUES: Minimal calcification is present at the right ECA origin without focal stenosis. BONES: No acute abnormality. CTA HEAD: ANTERIOR CIRCULATION: No significant stenosis of the  internal carotid arteries. No significant stenosis of the anterior cerebral arteries. No significant stenosis of the middle cerebral arteries. No aneurysm. POSTERIOR CIRCULATION: No significant stenosis of the posterior cerebral arteries. No significant stenosis of the basilar artery. No significant stenosis of the vertebral arteries. No aneurysm. OTHER: No dural venous sinus thrombosis on this non-dedicated study. IMPRESSION: 1. No acute intracranial or vascular abnormality. 2. Atherosclerotic calcifications at the aortic arch, subclavian origins, and carotid bifurcations without significant stenosis. 3. Mild tortuosity of the right common and internal carotid arteries without focal stenosis. Electronically signed by: Lonni Necessary MD 11/28/2023 07:45 PM EDT RP Workstation: [id number]   CT HEAD CODE STROKE WO CONTRAST (LKW 0-4.5h, LVO 0-24h) Result Date: 11/28/2023 EXAM: CT HEAD WITHOUT CONTRAST 11/28/2023 07:29:18 PM TECHNIQUE: CT of the head was performed without the administration of intravenous contrast. Automated exposure control, iterative reconstruction, and/or weight based adjustment of the mA/kV was utilized to reduce the radiation dose to as low as reasonably achievable. COMPARISON: None available. CLINICAL HISTORY: Neuro deficit, acute, stroke suspected. Code stroke; Seizures; unresponsive, not moving left side; LKW: 1830. FINDINGS: BRAIN AND VENTRICLES: No acute hemorrhage. No evidence of acute infarct. No hydrocephalus. No extra-axial  collection. No mass effect or midline shift. [place] stroke program early CT (ASPECT) score: Ganglionic (caudate, IC, lentiform nucleus, insula, M1-M3): 7 Supraganglionic (M4-M6): 3 Total: 10 ORBITS: Bilateral lens replacements are noted. The globes and orbits are otherwise within normal limits. SINUSES: Nasal airway is present. SOFT TISSUES AND SKULL: No acute soft tissue abnormality. No skull fracture. IMPRESSION: 1. No acute intracranial abnormality. The pertinent results were texted to Dr. Michaela via the [facility] system at 07:34 pm. Electronically signed by: Lonni Necessary MD 11/28/2023 07:34 PM EDT RP Workstation: [id number]     TODAY-DAY OF DISCHARGE:  Subjective:   Frances Phillips today has no headache,no chest abdominal pain,no new weakness tingling or numbness, feels much better wants to go home today.   Objective:   Blood pressure (!) 165/42, pulse 62, temperature 98.4 F (36.9 C), temperature source Oral, resp. rate 16, height 5' 0.98 (1.549 m), weight 65.7 kg, SpO2 98%. No intake or output data in the 24 hours ending 12/04/23 1058 Filed Weights   12/01/23 2332  Weight: 65.7 kg    Exam: Awake Alert, Oriented *3, No new F.N deficits, Normal affect [place].AT,PERRAL Supple Neck,No JVD, No cervical lymphadenopathy appriciated.  Symmetrical Chest wall movement, Good air movement bilaterally, CTAB RRR,No Gallops,Rubs or new Murmurs, No Parasternal Heave +ve B.Sounds, Abd Soft, Non tender, No organomegaly appriciated, No rebound -guarding or rigidity. No Cyanosis, Clubbing or edema, No new Rash or bruise   PERTINENT RADIOLOGIC STUDIES: No results found.   PERTINENT LAB RESULTS: CBC: No results for input(s): WBC, HGB, HCT, PLT in the last 72 hours. CMET CMP     Component Value Date/Time   NA 133 (L) 12/04/2023 0349   K 3.9 12/04/2023 0349   CL 99 12/04/2023 0349   CO2 26 12/04/2023 0349   GLUCOSE 75 12/04/2023 0349   BUN 19 12/04/2023 0349   CREATININE 1.10  (H) 12/04/2023 0349   CALCIUM 9.2 12/04/2023 0349   PROT 6.8 12/01/2023 0355   ALBUMIN 3.8 12/01/2023 0355   AST 33 12/01/2023 0355   ALT 22 12/01/2023 0355   ALKPHOS 67 12/01/2023 0355   BILITOT 1.1 12/01/2023 0355   GFRNONAA 51 (L) 12/04/2023 0349    GFR Estimated Creatinine Clearance: 35.4 mL/min (A) (by C-G formula based on SCr of 1.1 mg/dL (H)). No  results for input(s): LIPASE, AMYLASE in the last 72 hours. No results for input(s): CKTOTAL, CKMB, CKMBINDEX, TROPONINI in the last 72 hours. Invalid input(s): POCBNP No results for input(s): DDIMER in the last 72 hours. No results for input(s): HGBA1C in the last 72 hours. No results for input(s): CHOL, HDL, LDLCALC, TRIG, CHOLHDL, LDLDIRECT in the last 72 hours. No results for input(s): TSH, T4TOTAL, T3FREE, THYROIDAB in the last 72 hours.  Invalid input(s): FREET3 No results for input(s): VITAMINB12, FOLATE, FERRITIN, TIBC, IRON, RETICCTPCT in the last 72 hours. Coags: No results for input(s): INR in the last 72 hours.  Invalid input(s): PT Microbiology: No results found for this or any previous visit (from the past 240 hours).  FURTHER DISCHARGE INSTRUCTIONS:  Get Medicines reviewed and adjusted: Please take all your medications with you for your next visit with your Primary MD  Laboratory/radiological data: Please request your Primary MD to go over all hospital tests and procedure/radiological results at the follow up, please ask your Primary MD to get all Hospital records sent to his/her office.  In some cases, they will be blood work, cultures and biopsy results pending at the time of your discharge. Please request that your primary care M.D. goes through all the records of your hospital data and follows up on these results.  Also Note the following: If you experience worsening of your admission symptoms, develop shortness of breath, life threatening emergency,  suicidal or homicidal thoughts you must seek medical attention immediately by calling 911 or calling your MD immediately  if symptoms less severe.  You must read complete instructions/literature along with all the possible adverse reactions/side effects for all the Medicines you take and that have been prescribed to you. Take any new Medicines after you have completely understood and accpet all the possible adverse reactions/side effects.   Do not drive when taking Pain medications or sleeping medications (Benzodaizepines)  Do not take more than prescribed Pain, Sleep and Anxiety Medications. It is not advisable to combine anxiety,sleep and pain medications without talking with your primary care practitioner  Special Instructions: If you have smoked or chewed Tobacco  in the last 2 yrs please stop smoking, stop any regular Alcohol  and or any Recreational drug use.  Wear Seat belts while driving.  Please note: You were cared for by a hospitalist during your hospital stay. Once you are discharged, your primary care physician will handle any further medical issues. Please note that NO REFILLS for any discharge medications will be authorized once you are discharged, as it is imperative that you return to your primary care physician (or establish a relationship with a primary care physician if you do not have one) for your post hospital discharge needs so that they can reassess your need for medications and monitor your lab values.  Total Time spent coordinating discharge including counseling, education and face to face time equals greater than 30 minutes.  SignedBETHA Donalda Applebaum 12/04/2023 10:58 AM

## 2023-12-04 NOTE — Plan of Care (Signed)
  Problem: Education: Goal: Knowledge of General Education information will improve Description: Including pain rating scale, medication(s)/side effects and non-pharmacologic comfort measures Outcome: Completed/Met   Problem: Health Behavior/Discharge Planning: Goal: Ability to manage health-related needs will improve Outcome: Completed/Met   Problem: Clinical Measurements: Goal: Ability to maintain clinical measurements within normal limits will improve Outcome: Completed/Met Goal: Will remain free from infection Outcome: Completed/Met Goal: Diagnostic test results will improve Outcome: Completed/Met Goal: Respiratory complications will improve Outcome: Completed/Met Goal: Cardiovascular complication will be avoided Outcome: Completed/Met   Problem: Activity: Goal: Risk for activity intolerance will decrease Outcome: Completed/Met   Problem: Nutrition: Goal: Adequate nutrition will be maintained Outcome: Completed/Met   Problem: Coping: Goal: Level of anxiety will decrease Outcome: Completed/Met   Problem: Elimination: Goal: Will not experience complications related to bowel motility Outcome: Completed/Met Goal: Will not experience complications related to urinary retention Outcome: Completed/Met   Problem: Pain Managment: Goal: General experience of comfort will improve and/or be controlled Outcome: Completed/Met   Problem: Safety: Goal: Ability to remain free from injury will improve Outcome: Completed/Met   Problem: Skin Integrity: Goal: Risk for impaired skin integrity will decrease Outcome: Completed/Met   Problem: Acute Rehab PT Goals(only PT should resolve) Goal: Pt Will Go Supine/Side To Sit Outcome: Completed/Met Goal: Patient Will Perform Sitting Balance Outcome: Completed/Met Goal: Patient Will Transfer Sit To/From Stand Outcome: Completed/Met Goal: Pt Will Transfer Bed To Chair/Chair To Bed Outcome: Completed/Met Goal: Pt Will  Ambulate Outcome: Completed/Met   Problem: Acute Rehab OT Goals (only OT should resolve) Goal: Pt. Will Perform Grooming Outcome: Completed/Met Goal: Pt. Will Perform Upper Body Bathing Outcome: Completed/Met Goal: Pt. Will Perform Lower Body Bathing Outcome: Completed/Met Goal: Pt. Will Perform Upper Body Dressing Outcome: Completed/Met Goal: Pt. Will Perform Lower Body Dressing Outcome: Completed/Met Goal: Pt. Will Transfer To Toilet Outcome: Completed/Met   Problem: Inadequate Intake (NI-2.1) Goal: Food and/or nutrient delivery Description: Individualized approach for food/nutrient provision. Outcome: Completed/Met

## 2023-12-04 NOTE — Discharge Instructions (Signed)
Seizure precautions: °Per [place] DMV statutes, patients with seizures are not allowed to drive until they have been seizure-free for six months and cleared by a physician  °  °Use caution when using heavy equipment or power tools. Avoid working on ladders or at heights. Take showers instead of baths. Ensure the water temperature is not too high on the home water heater. Do not go swimming alone. Do not lock yourself in a room alone (i.e. bathroom). When caring for infants or small children, sit down when holding, feeding, or changing them to minimize risk of injury to the child in the event you have a seizure. Maintain good sleep hygiene. Avoid alcohol.  °  °If patient has another seizure, call 911 and bring them back to the ED if: °A.  The seizure lasts longer than 5 minutes.      °B.  The patient doesn't wake shortly after the seizure or has new problems such as difficulty seeing, speaking or moving following the seizure °C.  The patient was injured during the seizure °D.  The patient has a temperature over 102 F (39C) °E.  The patient vomited during the seizure and now is having trouble breathing °   °During the Seizure °  °- First, ensure adequate ventilation and place patients on the floor on their left side  °Loosen clothing around the neck and ensure the airway is patent. If the patient is clenching the teeth, do not force the mouth open with any object as this can cause severe damage °- Remove all items from the surrounding that can be hazardous. The patient may be oblivious to what's happening and may not even know what he or she is doing. °If the patient is confused and wandering, either gently guide him/her away and block access to outside areas °- Reassure the individual and be comforting °- Call 911. In most cases, the seizure ends before EMS arrives. However, there are cases when seizures may last over 3 to 5 minutes. Or the individual may have developed breathing difficulties or severe  injuries. If a pregnant patient or a person with diabetes develops a seizure, it is prudent to call an ambulance. °- Finally, if the patient does not regain full consciousness, then call EMS. Most patients will remain confused for about 45 to 90 minutes after a seizure, so you must use judgment in calling for help. °- Avoid restraints but make sure the patient is in a bed with padded side rails °- Place the individual in a lateral position with the neck slightly flexed; this will help the saliva drain from the mouth and prevent the tongue from falling backward °- Remove all nearby furniture and other hazards from the area °- Provide verbal assurance as the individual is regaining consciousness °- Provide the patient with privacy if possible °- Call for help and start treatment as ordered by the caregiver °  ° After the Seizure (Postictal Stage) °  °After a seizure, most patients experience confusion, fatigue, muscle pain and/or a headache. Thus, one should permit the individual to sleep. For the next few days, reassurance is essential. Being calm and helping reorient the person is also of importance. °  °Most seizures are painless and end spontaneously. Seizures are not harmful to others but can lead to complications such as stress on the lungs, brain and the heart. Individuals with prior lung problems may develop labored breathing and respiratory distress.  °  °

## 2023-12-04 NOTE — Progress Notes (Signed)
 Occupational Therapy Treatment [name] MRN: [id number] DOB: February 05, 1941 Today's Date: 12/04/2023   History of present illness Frances Phillips is a 83 y.o. female presents with a new onset seizure. PMH: hypertension, hyperlipidemia, CKD 3A, dementia, and hypothyroidism Dtr reports falling in shower approx 3 weeks ago hitting L side of head resulting in a lump but didn't seek medical attn.   OT comments  Patient with good progress toward patient focused goals.  Advanced to Min A with lower body ADL and overall generalized supervision with occasional CGA for mobility at RW level.  Family is planning on taking the patient home today, sounds as if [facility] PT will be seeing her.  HH OT can be considered.  OT will continue efforts if she remains in the acute setting.        If plan is discharge home, recommend the following:  A little help with walking and/or transfers;A little help with bathing/dressing/bathroom;Assist for transportation;Assistance with cooking/housework   Equipment Recommendations  None recommended by OT    Recommendations for Other Services      Precautions / Restrictions Precautions Precautions: Fall Recall of Precautions/Restrictions: Impaired Precaution/Restrictions Comments: pt on continuous EEG Restrictions Weight Bearing Restrictions Per Provider Order: No       Mobility Bed Mobility Overal bed mobility: Modified Independent                  Transfers Overall transfer level: Needs assistance Equipment used: Rolling walker (2 wheels) Transfers: Sit to/from Stand, Bed to chair/wheelchair/BSC Sit to Stand: Supervision     Step pivot transfers: Contact guard assist           Balance Overall balance assessment: Needs assistance Sitting-balance support: Feet supported Sitting balance-Leahy Scale: Good     Standing balance support: Reliant on assistive device for balance Standing balance-Leahy Scale: Fair                              ADL either performed or assessed with clinical judgement   ADL   Eating/Feeding: Set up;Sitting   Grooming: Supervision/safety;Standing           Upper Body Dressing : Set up;Sitting   Lower Body Dressing: Contact guard assist;Sit to/from stand;Minimal assistance   Toilet Transfer: Contact guard Actor;Ambulation                  Extremity/Trunk Assessment Upper Extremity Assessment Upper Extremity Assessment: Overall WFL for tasks assessed   Lower Extremity Assessment Lower Extremity Assessment: Defer to PT evaluation   Cervical / Trunk Assessment Cervical / Trunk Assessment: Normal    Vision   Vision Assessment?: No apparent visual deficits   Perception Perception Perception: Not tested   Praxis Praxis Praxis: Not tested   Communication Communication Communication: No apparent difficulties   Cognition Arousal: Alert Behavior During Therapy: WFL for tasks assessed/performed Cognition: History of cognitive impairments                               Following commands: Intact        Cueing   Cueing Techniques: Verbal cues, Gestural cues, Tactile cues                     Pertinent Vitals/ Pain       Pain Assessment Pain Assessment: No/denies pain Pain Intervention(s): Premedicated before session  Frequency  Min 2X/week        Progress Toward Goals  OT Goals(current goals can now be found in the care plan section)  Progress towards OT goals: Progressing toward goals  Acute Rehab OT Goals OT Goal Formulation: With patient Time For Goal Achievement: 12/15/23 Potential to Achieve Goals: Good  Plan      Co-evaluation                 AM-PAC OT 6 Clicks Daily Activity     Outcome Measure   Help from another person eating meals?: None Help from another person taking care of personal  grooming?: A Little Help from another person toileting, which includes using toliet, bedpan, or urinal?: A Little Help from another person bathing (including washing, rinsing, drying)?: A Little Help from another person to put on and taking off regular upper body clothing?: A Little Help from another person to put on and taking off regular lower body clothing?: A Little 6 Click Score: 19    End of Session Equipment Utilized During Treatment: Gait belt  OT Visit Diagnosis: Unsteadiness on feet (R26.81);Muscle weakness (generalized) (M62.81);History of falling (Z91.81);Other symptoms and signs involving cognitive function   Activity Tolerance Patient tolerated treatment well   Patient Left in chair;with call bell/phone within reach;with chair alarm set;with family/visitor present   Nurse Communication Mobility status        Time: 1020-1041 OT Time Calculation (min): 21 min  Charges: OT General Charges $OT Visit: 1 Visit OT Treatments $Self Care/Home Management : 8-22 mins  12/04/2023  RP, OTR/L  Acute Rehabilitation Services  Office:  [phone]   Charlie JONETTA Halsted 12/04/2023, 10:45 AM

## 2023-12-04 NOTE — Plan of Care (Signed)

## 2023-12-04 NOTE — Psychosocial Assessment (Signed)
 Explained discharge instructions to patient. Reviewed follow up appointment and next medication administration times. Also reviewed education. Patient verbalized having an understanding for instructions given. All belongings are in the patient's possession. Will pick up TOC meds on the way out for discharge. IV and telemetry were removed. CCMD was notified. No other needs verbalized. Will have patient Transported downstairs for discharge.

## 2023-12-04 NOTE — TOC Transition Note (Signed)
 Transition of Care [facility]) - Discharge Note   Patient Details  Name: Frances Phillips MRN: [id number] Date of Birth: Jun 17, 1940  [facility]) CM/SW Contact:  Andrez JULIANNA George, RN Phone Number: 12/04/2023, 11:10 AM   Clinical Narrative:     Pt is discharging home with home health services through [facility]. Information on the AVS. [facility] will contact her for the first home visit. Family to transport home.    Final next level of care: Home w Home Health Services Barriers to Discharge: No Barriers Identified   Patient Goals and CMS Choice Patient states their goals for this hospitalization and ongoing recovery are:: Return home CMS Medicare.gov Compare Post Acute Care list provided to:: Patient Represenative (must comment) Choice offered to / list presented to : Adult Children [place] ownership interest in [facility].provided to:: Adult Children    Discharge Placement                       Discharge Plan and Services Additional resources added to the After Visit Summary for   In-house Referral: Clinical Social Work Discharge Planning Services: CM Consult Post Acute Care Choice: Durable Medical Equipment, Home Health          DME Arranged: Bedside commode DME Agency: [employer] Date DME Agency Contacted: 12/03/23 Time DME Agency Contacted: [phone] Representative spoke with at DME Agency: [place] HH Arranged: RN, PT, OT [facility] Agency: [facility] Date [facility] Agency Contacted: 12/03/23 Time HH Agency Contacted: [phone] Representative spoke with at [facility] Agency: [facility]  Social Drivers of Health (SDOH) Interventions SDOH Screenings   Food Insecurity: Patient Unable To Answer (11/30/2023)  Housing: Unknown (11/30/2023)  Transportation Needs: Patient Unable To Answer (11/30/2023)  Utilities: Patient Unable To Answer (11/30/2023)  Financial Resource Strain: Low Risk  (01/06/2019)   Received from [facility]  Social Connections: Patient Unable  To Answer (11/30/2023)  Tobacco Use: Low Risk  (11/28/2023)  Health Literacy: Low Risk  (05/28/2020)   Received from [facility]     Readmission Risk Interventions     No data to display

## 2024-10-26 ENCOUNTER — Ambulatory Visit: Admitting: Dermatology
